# Patient Record
Sex: Male | Born: 1965 | Race: White | Hispanic: No | Marital: Single | Smoking: Former smoker
Health system: Southern US, Community
[De-identification: ages and names within clinical notes are randomized; demographics above are authoritative.]

## PROBLEM LIST (undated history)

## (undated) DIAGNOSIS — S2239XA Fracture of one rib, unspecified side, initial encounter for closed fracture: Secondary | ICD-10-CM

## (undated) DIAGNOSIS — S0990XA Unspecified injury of head, initial encounter: Secondary | ICD-10-CM

## (undated) DIAGNOSIS — S2249XA Multiple fractures of ribs, unspecified side, initial encounter for closed fracture: Secondary | ICD-10-CM

## (undated) HISTORY — PX: ABDOMINAL SURGERY: SHX537

---
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
## 1997-07-18 ENCOUNTER — Encounter: Admission: RE | Admit: 1997-07-18 | Discharge: 1997-10-16 | Payer: Self-pay

## 1997-07-31 ENCOUNTER — Encounter: Admission: RE | Admit: 1997-07-31 | Discharge: 1997-10-29 | Payer: Self-pay

## 1998-11-12 ENCOUNTER — Encounter: Admission: RE | Admit: 1998-11-12 | Discharge: 1999-02-10 | Payer: Self-pay

## 1999-04-17 ENCOUNTER — Emergency Department (HOSPITAL_COMMUNITY): Admission: EM | Admit: 1999-04-17 | Discharge: 1999-04-17 | Payer: Self-pay | Admitting: Emergency Medicine

## 1999-04-17 ENCOUNTER — Encounter: Payer: Self-pay | Admitting: Emergency Medicine

## 2000-12-06 ENCOUNTER — Emergency Department (HOSPITAL_COMMUNITY): Admission: EM | Admit: 2000-12-06 | Discharge: 2000-12-06 | Payer: Self-pay | Admitting: Emergency Medicine

## 2000-12-06 ENCOUNTER — Encounter: Payer: Self-pay | Admitting: Emergency Medicine

## 2005-07-26 ENCOUNTER — Emergency Department (HOSPITAL_COMMUNITY): Admission: EM | Admit: 2005-07-26 | Discharge: 2005-07-26 | Payer: Self-pay | Admitting: Emergency Medicine

## 2007-09-28 ENCOUNTER — Emergency Department (HOSPITAL_COMMUNITY): Admission: EM | Admit: 2007-09-28 | Discharge: 2007-09-29 | Payer: Self-pay | Admitting: Emergency Medicine

## 2011-01-14 LAB — BASIC METABOLIC PANEL
BUN: 18
CO2: 28
Calcium: 9
Chloride: 109
Creatinine, Ser: 0.97
GFR calc Af Amer: >60
GFR calc non Af Amer: >60
Glucose, Bld: 115 — ABNORMAL HIGH
Potassium: 3.7
Sodium: 138

## 2011-01-14 LAB — DIFFERENTIAL
Basophils Absolute: 0
Basophils Relative: 0
Eosinophils Absolute: 0.1
Eosinophils Relative: 0
Lymphocytes Relative: 6 — ABNORMAL LOW
Lymphs Abs: 1
Monocytes Absolute: 1.1 — ABNORMAL HIGH
Monocytes Relative: 7
Neutro Abs: 14.5 — ABNORMAL HIGH
Neutrophils Relative %: 86 — ABNORMAL HIGH

## 2011-01-14 LAB — CBC
HCT: 36.3 — ABNORMAL LOW
Hemoglobin: 12.8 — ABNORMAL LOW
MCHC: 35.3
MCV: 90.9
Platelets: 171
RBC: 3.99 — ABNORMAL LOW
RDW: 12.5
WBC: 16.8 — ABNORMAL HIGH

## 2011-06-19 ENCOUNTER — Encounter (HOSPITAL_COMMUNITY): Payer: Self-pay | Admitting: *Deleted

## 2011-06-19 ENCOUNTER — Emergency Department (HOSPITAL_COMMUNITY)
Admission: EM | Admit: 2011-06-19 | Discharge: 2011-06-19 | Disposition: A | Discharge status: Home / Self Care | Payer: Self-pay | Attending: Emergency Medicine | Admitting: Emergency Medicine

## 2011-06-19 ENCOUNTER — Emergency Department (HOSPITAL_COMMUNITY): Payer: Self-pay

## 2011-06-19 ENCOUNTER — Other Ambulatory Visit: Payer: Self-pay

## 2011-06-19 DIAGNOSIS — R05 Cough: Secondary | ICD-10-CM | POA: Insufficient documentation

## 2011-06-19 DIAGNOSIS — J40 Bronchitis, not specified as acute or chronic: Secondary | ICD-10-CM | POA: Insufficient documentation

## 2011-06-19 DIAGNOSIS — R079 Chest pain, unspecified: Secondary | ICD-10-CM | POA: Insufficient documentation

## 2011-06-19 DIAGNOSIS — R509 Fever, unspecified: Secondary | ICD-10-CM | POA: Insufficient documentation

## 2011-06-19 DIAGNOSIS — R059 Cough, unspecified: Secondary | ICD-10-CM | POA: Insufficient documentation

## 2011-06-19 HISTORY — DX: Unspecified injury of head, initial encounter: S09.90XA

## 2011-06-19 LAB — DIFFERENTIAL
Basophils Absolute: 0 10*3/uL (ref 0.0–0.1)
Basophils Relative: 0 % (ref 0–1)
Eosinophils Absolute: 0 10*3/uL (ref 0.0–0.7)
Eosinophils Relative: 0 % (ref 0–5)
Lymphocytes Relative: 20 % (ref 12–46)

## 2011-06-19 LAB — CBC
MCH: 31.3 pg (ref 26.0–34.0)
MCHC: 34.1 g/dL (ref 30.0–36.0)
MCV: 91.9 fL (ref 78.0–100.0)
Platelets: 134 10*3/uL — ABNORMAL LOW (ref 150–400)
RDW: 12.7 % (ref 11.5–15.5)
WBC: 10 10*3/uL (ref 4.0–10.5)

## 2011-06-19 LAB — BASIC METABOLIC PANEL
Calcium: 9.2 mg/dL (ref 8.4–10.5)
GFR calc Af Amer: >90 mL/min (ref >90)
GFR calc non Af Amer: 85 mL/min — ABNORMAL LOW (ref >90)
Sodium: 134 mEq/L — ABNORMAL LOW (ref 135–145)

## 2011-06-19 LAB — TROPONIN I: Troponin I: <0.3 ng/mL (ref <0.30)

## 2011-06-19 MED ORDER — GUAIFENESIN-CODEINE 100-10 MG/5ML PO SYRP: ORAL_SOLUTION | ORAL | Status: DC

## 2011-06-19 MED ORDER — HYDROCODONE-ACETAMINOPHEN 5-325 MG PO TABS
1.0000 | ORAL_TABLET | Freq: Once | ORAL | Status: AC
  Administered 2011-06-19: 1 via ORAL
  Filled 2011-06-19: qty 1

## 2011-06-19 MED ORDER — HYDROCODONE-ACETAMINOPHEN 5-325 MG PO TABS: 1.0000 | ORAL_TABLET | Freq: Once | ORAL | Status: DC

## 2011-06-19 MED ORDER — IBUPROFEN 800 MG PO TABS
800.0000 mg | ORAL_TABLET | Freq: Once | ORAL | Status: AC
  Administered 2011-06-19: 800 mg via ORAL
  Filled 2011-06-19: qty 1

## 2011-06-19 MED ORDER — IBUPROFEN 800 MG PO TABS: 800.0000 mg | ORAL_TABLET | Freq: Once | ORAL | Status: DC

## 2011-06-19 NOTE — ED Provider Notes (Signed)
History     CSN: [id number]  Arrival date & time 06/19/11  1478   First MD Initiated Contact with Patient 06/19/11 [phone]      Chief Complaint  Patient presents with  . Chest Pain    (Consider location/radiation/quality/duration/timing/severity/associated sxs/prior treatment) HPI Comments: Pt has had cough prod of brownish phlegm.  + subjecrtive fever and chills.  Pressure like pain all over anterior chest unchanged x 2 days.  "sharp" R upper anterior CP worse with deep inspiration.  Patient is a 46 y.o. male presenting with chest pain. The history is provided by the patient. No language interpreter was used.  Chest Pain The chest pain began 2 days ago. Chest pain occurs constantly. The chest pain is unchanged. The pain is associated with breathing. The severity of the pain is moderate. The quality of the pain is described as pressure-like and sharp. The pain does not radiate. Chest pain is worsened by deep breathing. Primary symptoms include cough. Pertinent negatives for primary symptoms include no fever, no shortness of breath, no wheezing, no nausea and no vomiting.  Pertinent negatives for associated symptoms include no claudication, no diaphoresis, no lower extremity edema, no near-syncope, no numbness, no orthopnea, no paroxysmal nocturnal dyspnea and no weakness. He tried nothing for the symptoms. Risk factors include male gender and smoking/tobacco exposure.     Past Medical History  Diagnosis Date  . Head injury     Past Surgical History  Procedure Date  . Abdominal surgery     No family history on file.  History  Substance Use Topics  . Smoking status: Current Everyday Smoker  . Smokeless tobacco: Not on file  . Alcohol Use: No      Review of Systems  Constitutional: Negative for fever and diaphoresis.  Respiratory: Positive for cough. Negative for shortness of breath and wheezing.   Cardiovascular: Positive for chest pain. Negative for orthopnea, claudication  and near-syncope.  Gastrointestinal: Negative for nausea and vomiting.  Neurological: Negative for syncope, weakness, light-headedness, numbness and headaches.  All other systems reviewed and are negative.    Allergies  Review of patient's allergies indicates no known allergies.  Home Medications   Current Outpatient Rx  Name Route Sig Dispense Refill  . GUAIFENESIN-CODEINE 100-10 MG/5ML PO SYRP  10 ml q 4-6 hrs prn cough 240 mL 0    BP 104/74  Pulse 73  Temp(Src) 98.9 F (37.2 C) (Oral)  Resp 18  Ht 5\' 6"  (1.676 m)  Wt 155 lb (70.308 kg)  BMI 25.02 kg/m2  SpO2 95%  Physical Exam  Nursing note and vitals reviewed. Constitutional: He is oriented to person, place, and time. He appears well-developed and well-nourished.  HENT:  Head: Normocephalic and atraumatic.  Eyes: EOM are normal.  Neck: Normal range of motion.  Cardiovascular: Normal rate, regular rhythm, S1 normal, S2 normal, normal heart sounds and intact distal pulses.   No extrasystoles are present. PMI is not displaced.  Exam reveals no gallop, no distant heart sounds, no friction rub and no decreased pulses.   No murmur heard. Pulmonary/Chest: Effort normal and breath sounds normal. No accessory muscle usage. Not tachypneic. No respiratory distress. He has no decreased breath sounds. He has no wheezes. He has no rhonchi. He has no rales. He exhibits no tenderness.  Abdominal: Soft. He exhibits no distension. There is no tenderness.  Musculoskeletal: Normal range of motion.  Neurological: He is alert and oriented to person, place, and time.  Skin: Skin is warm and  dry.  Psychiatric: He has a normal mood and affect. Judgment normal.    ED Course  Procedures (including critical care time)  Labs Reviewed  CBC - Abnormal; Notable for the following:    Platelets 134 (*)    All other components within normal limits  DIFFERENTIAL - Abnormal; Notable for the following:    Monocytes Absolute 1.2 (*)    All other  components within normal limits  BASIC METABOLIC PANEL - Abnormal; Notable for the following:    Sodium 134 (*)    GFR calc non Af Amer 85 (*)    All other components within normal limits  TROPONIN I   Dg Chest 2 View  06/19/2011  *RADIOLOGY REPORT*  Clinical Data: 46 year old male with cough, fever and congestion.  CHEST - 2 VIEW  Comparison: 09/29/2007 chest radiograph  Findings: The cardiomediastinal silhouette is unremarkable. Elevation of the right hemidiaphragm is again noted with mild right basilar atelectasis/scarring. There may be very small bilateral pleural effusions present. There is no evidence of pneumothorax or pulmonary edema. No acute bony abnormalities are identified.  IMPRESSION: Mild right basilar atelectasis/scarring.  Question very small bilateral pleural effusions.  Original Report Authenticated By: Rosendo Gros, M.D.    Date: 06/19/2011  Rate: 72  Rhythm: normal sinus rhythm  QRS Axis: normal  Intervals: normal  ST/T Wave abnormalities: normal  Conduction Disutrbances:none  Narrative Interpretation:   Old EKG Reviewed: none available    1. Bronchitis   2. Chest pain       MDM  Cardiac w/u nl.  Bronchitis clinically. rx-cough medicine.  Return if sxs wworsen or change.        Worthy Rancher, PA 06/19/11 1317  Worthy Rancher, PA 06/25/11 [phone]

## 2011-06-19 NOTE — ED Notes (Signed)
Pa in with pt at this time 

## 2011-06-19 NOTE — ED Notes (Signed)
Family at bedside. Patient states he is not feeling any better Patient is still having pain. Patient states his pain is around a 7 at this time. RN notified.

## 2011-06-19 NOTE — ED Notes (Signed)
Non radiating, substernal CP began 2 days ago. Described as "pressure" pain which became worse last night.

## 2011-06-19 NOTE — ED Provider Notes (Signed)
Patient here with c/o chest pain worse with exertion, and movement.   Cor: RRR Chest: clear to auscultation all fields Chest wall: mild tenderness   Labs, troponin, EKG and chest xray without acute findings.   Nicoletta Dress. Colon Branch, MD 06/19/11 1500

## 2011-06-19 NOTE — Discharge Instructions (Signed)
Bronchitis Bronchitis is the body's way of reacting to injury and/or infection (inflammation) of the bronchi. Bronchi are the air tubes that extend from the windpipe into the lungs. If the inflammation becomes severe, it may cause shortness of breath. CAUSES  Inflammation may be caused by:  A virus.   Germs (bacteria).   Dust.   Allergens.   Pollutants and many other irritants.  The cells lining the bronchial tree are covered with tiny hairs (cilia). These constantly beat upward, away from the lungs, toward the mouth. This keeps the lungs free of pollutants. When these cells become too irritated and are unable to do their job, mucus begins to develop. This causes the characteristic cough of bronchitis. The cough clears the lungs when the cilia are unable to do their job. Without either of these protective mechanisms, the mucus would settle in the lungs. Then you would develop pneumonia. Smoking is a common cause of bronchitis and can contribute to pneumonia. Stopping this habit is the single most important thing you can do to help yourself. TREATMENT   Your caregiver may prescribe an antibiotic if the cough is caused by bacteria. Also, medicines that open up your airways make it easier to breathe. Your caregiver may also recommend or prescribe an expectorant. It will loosen the mucus to be coughed up. Only take over-the-counter or prescription medicines for pain, discomfort, or fever as directed by your caregiver.   Removing whatever causes the problem (smoking, for example) is critical to preventing the problem from getting worse.   Cough suppressants may be prescribed for relief of cough symptoms.   Inhaled medicines may be prescribed to help with symptoms now and to help prevent problems from returning.   For those with recurrent (chronic) bronchitis, there may be a need for steroid medicines.  SEEK IMMEDIATE MEDICAL CARE IF:   During treatment, you develop more pus-like mucus  (purulent sputum).   You have a fever.   Your baby is older than 3 months with a rectal temperature of 102 F (38.9 C) or higher.   Your baby is 22 months old or younger with a rectal temperature of 100.4 F (38 C) or higher.   You become progressively more ill.   You have increased difficulty breathing, wheezing, or shortness of breath.  It is necessary to seek immediate medical care if you are elderly or sick from any other disease. MAKE SURE YOU:   Understand these instructions.   Will watch your condition.   Will get help right away if you are not doing well or get worse.  Document Released: 04/05/2005 Document Revised: 12/16/2010 Document Reviewed: 02/13/2008 [facility] Patient Information 2012 [place], [place].Chest Pain (Nonspecific) It is often hard to give a specific diagnosis for the cause of chest pain. There is always a chance that your pain could be related to something serious, such as a heart attack or a blood clot in the lungs. You need to follow up with your caregiver for further evaluation. CAUSES   Heartburn.   Pneumonia or bronchitis.   Anxiety and stress.   Inflammation around your heart (pericarditis) or lung (pleuritis or pleurisy).   A blood clot in the lung.   A collapsed lung (pneumothorax). It can develop suddenly on its own (spontaneous pneumothorax) or from injury (trauma) to the chest.  The chest wall is composed of bones, muscles, and cartilage. Any of these can be the source of the pain.  The bones can be bruised by injury.   The  muscles or cartilage can be strained by coughing or overwork.   The cartilage can be affected by inflammation and become sore (costochondritis).  DIAGNOSIS  Lab tests or other studies, such as X-rays, an EKG, stress testing, or cardiac imaging, may be needed to find the cause of your pain.  TREATMENT   Treatment depends on what may be causing your chest pain. Treatment may include:   Acid blockers for heartburn.     Anti-inflammatory medicine.   Pain medicine for inflammatory conditions.   Antibiotics if an infection is present.   You may be advised to change lifestyle habits. This includes stopping smoking and avoiding caffeine and chocolate.   You may be advised to keep your head raised (elevated) when sleeping. This reduces the chance of acid going backward from your stomach into your esophagus.   Most of the time, nonspecific chest pain will improve within 2 to 3 days with rest and mild pain medicine.  HOME CARE INSTRUCTIONS   If antibiotics were prescribed, take the full amount even if you start to feel better.   For the next few days, avoid physical activities that bring on chest pain. Continue physical activities as directed.   Do not smoke cigarettes or drink alcohol until your symptoms are gone.   Only take over-the-counter or prescription medicine for pain, discomfort, or fever as directed by your caregiver.   Follow your caregiver's suggestions for further testing if your chest pain does not go away.   Keep any follow-up appointments you made. If you do not go to an appointment, you could develop lasting (chronic) problems with pain. If there is any problem keeping an appointment, you must call to reschedule.  SEEK MEDICAL CARE IF:   You think you are having problems from the medicine you are taking. Read your medicine instructions carefully.   Your chest pain does not go away, even after treatment.   You develop a rash with blisters on your chest.  SEEK IMMEDIATE MEDICAL CARE IF:   You have increased chest pain or pain that spreads to your arm, neck, jaw, back, or belly (abdomen).   You develop shortness of breath, an increasing cough, or you are coughing up blood.   You have severe back or abdominal pain, feel sick to your stomach (nauseous) or throw up (vomit).   You develop severe weakness, fainting, or chills.   You have an oral temperature above 102 F (38.9 C), not  controlled by medicine.  THIS IS AN EMERGENCY. Do not wait to see if the pain will go away. Get medical help at once. Call your local emergency services (911 in [place] Do not drive yourself to the hospital. MAKE SURE YOU:   Understand these instructions.   Will watch your condition.   Will get help right away if you are not doing well or get worse.  Document Released: 01/13/2005 Document Revised: 12/16/2010 Document Reviewed: 11/09/2007 [facility] Patient Information 2012 [place], [place].   Your cardiac work up is normal.  The chest x-ray is normal but clinically you have bronchitis.  Take ibuprofen 800 mg every 8 hrs with food.  Take the cough medicine as directed.  Return to the ED if your symptoms worsen or change significantly.

## 2011-06-26 NOTE — ED Provider Notes (Signed)
Medical screening examination/treatment/procedure(s) were conducted as a shared visit with non-physician practitioner(s) and myself.  I personally evaluated the patient during the encounter  Nicoletta Dress. Colon Branch, MD 06/26/11 1431

## 2014-10-04 ENCOUNTER — Encounter (HOSPITAL_COMMUNITY): Payer: Self-pay | Admitting: *Deleted

## 2014-10-04 ENCOUNTER — Emergency Department (HOSPITAL_COMMUNITY): Payer: Self-pay

## 2014-10-04 ENCOUNTER — Emergency Department (HOSPITAL_COMMUNITY)
Admission: EM | Admit: 2014-10-04 | Discharge: 2014-10-04 | Disposition: A | Discharge status: Home / Self Care | Payer: Self-pay | Attending: Emergency Medicine | Admitting: Emergency Medicine

## 2014-10-04 DIAGNOSIS — Z720 Tobacco use: Secondary | ICD-10-CM | POA: Insufficient documentation

## 2014-10-04 DIAGNOSIS — W19XXXA Unspecified fall, initial encounter: Secondary | ICD-10-CM

## 2014-10-04 DIAGNOSIS — Y93E1 Activity, personal bathing and showering: Secondary | ICD-10-CM | POA: Insufficient documentation

## 2014-10-04 DIAGNOSIS — Y998 Other external cause status: Secondary | ICD-10-CM | POA: Insufficient documentation

## 2014-10-04 DIAGNOSIS — W01198A Fall on same level from slipping, tripping and stumbling with subsequent striking against other object, initial encounter: Secondary | ICD-10-CM | POA: Insufficient documentation

## 2014-10-04 DIAGNOSIS — S7001XA Contusion of right hip, initial encounter: Secondary | ICD-10-CM | POA: Insufficient documentation

## 2014-10-04 DIAGNOSIS — Y92091 Bathroom in other non-institutional residence as the place of occurrence of the external cause: Secondary | ICD-10-CM | POA: Insufficient documentation

## 2014-10-04 DIAGNOSIS — G8929 Other chronic pain: Secondary | ICD-10-CM | POA: Insufficient documentation

## 2014-10-04 DIAGNOSIS — S39012A Strain of muscle, fascia and tendon of lower back, initial encounter: Secondary | ICD-10-CM | POA: Insufficient documentation

## 2014-10-04 MED ORDER — CYCLOBENZAPRINE HCL 10 MG PO TABS: 10.0000 mg | ORAL_TABLET | Freq: Two times a day (BID) | ORAL | Status: DC | PRN

## 2014-10-04 MED ORDER — FENTANYL CITRATE (PF) 100 MCG/2ML IJ SOLN
50.0000 ug | Freq: Once | INTRAMUSCULAR | Status: AC
  Administered 2014-10-04: 50 ug via INTRAMUSCULAR
  Filled 2014-10-04: qty 2

## 2014-10-04 MED ORDER — HYDROCODONE-ACETAMINOPHEN 5-325 MG PO TABS: 1.0000 | ORAL_TABLET | Freq: Four times a day (QID) | ORAL | Status: DC | PRN

## 2014-10-04 MED ORDER — NAPROXEN 500 MG PO TABS: 500.0000 mg | ORAL_TABLET | Freq: Two times a day (BID) | ORAL | Status: DC

## 2014-10-04 NOTE — ED Notes (Signed)
Pt fell backwards while getting out of the shower and hit the toilet. Pain to right hip. Fall occurred at ~0700

## 2014-10-04 NOTE — ED Provider Notes (Signed)
CSN: [id number]     Arrival date & time 10/04/14  0800 History  This chart was scribed for Vanetta Mulders, MD by Placido Sou, ED scribe. This patient was seen in room APA01/APA01 and the patient's care was started at 8:24 AM.    Chief Complaint  Patient presents with  . Hip Pain    Patient is a 49 y.o. male presenting with hip pain. The history is provided by the patient. No language interpreter was used.  Hip Pain The current episode started 1 to 2 hours ago. The problem occurs constantly. The problem has not changed since onset.Pertinent negatives include no chest pain, no abdominal pain, no headaches and no shortness of breath. The symptoms are aggravated by standing and exertion. He has tried nothing for the symptoms.    HPI Comments: Caleb Blake is a 49 y.o. male, with a history of chronic neck pain, who presents to the Emergency Department complaining of a fall that occurred 2 hours ago. Pt notes falling backwards while exiting the shower. He notes lower right back and hip pain. He notes being able to ambulate but does note a worsening of symptoms. He would rate the pain as a 5/10 when immobile and 9/10 when ambulatory. He denies seeing an orthopedist in the past and doesn't currently have a PCP. He denies any other pain due to the fall. Pt denies LOC, head trauma, numbness in right foot, fever, chills, visual changes, cold symptoms, CP, SOB, abd pain, nausea, vomiting, diarrhea, dysuria, leg swelling, rash, history of bleeding easily, and HA.  Past Medical History  Diagnosis Date  . Head injury    Past Surgical History  Procedure Laterality Date  . Abdominal surgery     No family history on file. History  Substance Use Topics  . Smoking status: Current Every Day Smoker    Types: Cigarettes  . Smokeless tobacco: Not on file  . Alcohol Use: No    Review of Systems  Constitutional: Negative for fever and chills.  HENT: Negative for congestion, rhinorrhea and sore throat.    Eyes: Negative for visual disturbance.  Respiratory: Negative for shortness of breath.   Cardiovascular: Negative for chest pain and leg swelling.  Gastrointestinal: Negative for nausea, vomiting, abdominal pain and diarrhea.  Genitourinary: Negative for dysuria.  Musculoskeletal: Positive for back pain, neck pain and neck stiffness.  Skin: Negative for rash.  Neurological: Negative for syncope, numbness and headaches.  Hematological: Does not bruise/bleed easily.  Psychiatric/Behavioral: Negative for confusion.      Allergies  Review of patient's allergies indicates no known allergies.  Home Medications   Prior to Admission medications   Medication Sig Start Date End Date Taking? Authorizing Provider  cyclobenzaprine (FLEXERIL) 10 MG tablet Take 1 tablet (10 mg total) by mouth 2 (two) times daily as needed for muscle spasms. 10/04/14   Vanetta Mulders, MD  HYDROcodone-acetaminophen (NORCO/VICODIN) 5-325 MG per tablet Take 1-2 tablets by mouth every 6 (six) hours as needed. 10/04/14   Vanetta Mulders, MD  naproxen (NAPROSYN) 500 MG tablet Take 1 tablet (500 mg total) by mouth 2 (two) times daily. 10/04/14   Vanetta Mulders, MD   BP 142/87 mmHg  Pulse 50  Temp(Src) 98 F (36.7 C) (Oral)  Resp 16  Ht 5\' 6"  (1.676 m)  Wt 140 lb (63.504 kg)  BMI 22.61 kg/m2  SpO2 100% Physical Exam  Constitutional: He is oriented to person, place, and time. He appears well-developed and well-nourished. No distress.  HENT:  Head: Normocephalic and atraumatic.  Mouth/Throat: Oropharynx is clear and moist.  Eyes: Conjunctivae and EOM are normal. Pupils are equal, round, and reactive to light. No scleral icterus.  Neck: Normal range of motion. Neck supple. No tracheal deviation present.  Cardiovascular: Normal rate, regular rhythm, normal heart sounds and intact distal pulses.   1+ DP pulse right side  Pulmonary/Chest: Effort normal and breath sounds normal. No respiratory distress. He has no  wheezes. He has no rales.  Abdominal: Soft. Bowel sounds are normal. There is no tenderness.  Musculoskeletal: Normal range of motion. He exhibits tenderness. He exhibits no edema.  No edema in right knee; non-tender in right thigh, right buttox and over lumbar spine tender in right lower back; slightly tender in right hip  Neurological: He is alert and oriented to person, place, and time. No cranial nerve deficit. He exhibits normal muscle tone. Coordination normal.  Skin: Skin is warm and dry.  Psychiatric: He has a normal mood and affect. His behavior is normal.  Nursing note and vitals reviewed.   ED Course  Procedures  DIAGNOSTIC STUDIES: Oxygen Saturation is 100% on RA, normal by my interpretation.    COORDINATION OF CARE: 8:31 AM Discussed treatment plan with pt at bedside including a shot of pain medication and x-rays of the affected area and pt agreed to plan.  Labs Review Labs Reviewed - No data to display  Imaging Review Dg Lumbar Spine Complete  10/04/2014   CLINICAL DATA:  Fall.  Right hip pain.  EXAM: LUMBAR SPINE - COMPLETE 4+ VIEW  COMPARISON:  None.  FINDINGS: There is 10 degrees of levoconvex lumbar scoliosis between L5 and T12.  The bowel anastomotic clips noted in the right abdomen.  Visualized sacral arcuate lines appear intact.  Mild degenerative arthropathy of the facet joints of L5-S1 bilaterally.  No vertebral subluxation is observed. Anterior interbody spurring at several levels, most notably L3-4.  IMPRESSION: No lumbar spine fracture or acute subluxation. Mild lumbar spondylosis.   Electronically Signed   By: Gaylyn Rong M.D.   On: 10/04/2014 09:38   Dg Hips Bilat With Pelvis Min 5 Views  10/04/2014   CLINICAL DATA:  Fall, fell backwards getting out of the shower. The toilet. Right hip pain.  EXAM: BILATERAL HIP (WITH PELVIS) 5-6 VIEWS  COMPARISON:  None.  FINDINGS: There is no evidence of hip fracture or dislocation. There is no evidence of arthropathy  or other focal bone abnormality.  IMPRESSION: No acute osseous injury of bilateral hips.   Electronically Signed   By: Elige Ko   On: 10/04/2014 09:37     EKG Interpretation None      MDM   Final diagnoses:  Fall  Contusion, hip, right, initial encounter  Lumbar strain, initial encounter    X-rays of the back and pelvic and hip area without any acute findings. Patient in no acute distress. No other significant injuries. Will treat symptomatically.     I personally performed the services described in this documentation, which was scribed in my presence. The recorded information has been reviewed and is accurate.     Vanetta Mulders, MD 10/04/14 1209

## 2014-10-04 NOTE — Discharge Instructions (Signed)
X-rays of the back and hip and pelvis area without any acute findings. Take the Naprosyn on a regular basis. Take Flexeril as needed. Take the hydrocodone as needed. Return for any new or worse symptoms. Expect to be sore and stiff for several days.

## 2019-08-15 ENCOUNTER — Encounter (HOSPITAL_COMMUNITY): Payer: Self-pay | Admitting: Emergency Medicine

## 2019-08-15 ENCOUNTER — Other Ambulatory Visit: Payer: Self-pay

## 2019-08-15 ENCOUNTER — Emergency Department (HOSPITAL_COMMUNITY): Payer: Medicare Other

## 2019-08-15 ENCOUNTER — Inpatient Hospital Stay (HOSPITAL_COMMUNITY)
Admission: EM | Admit: 2019-08-15 | Discharge: 2019-08-19 | DRG: 551 | Disposition: A | Discharge status: Home Health | Payer: Medicare Other | Attending: Surgery | Admitting: Surgery

## 2019-08-15 DIAGNOSIS — Y93H9 Activity, other involving exterior property and land maintenance, building and construction: Secondary | ICD-10-CM

## 2019-08-15 DIAGNOSIS — S22038A Other fracture of third thoracic vertebra, initial encounter for closed fracture: Principal | ICD-10-CM | POA: Diagnosis present

## 2019-08-15 DIAGNOSIS — S2241XA Multiple fractures of ribs, right side, initial encounter for closed fracture: Secondary | ICD-10-CM | POA: Diagnosis present

## 2019-08-15 DIAGNOSIS — Z20822 Contact with and (suspected) exposure to covid-19: Secondary | ICD-10-CM | POA: Diagnosis present

## 2019-08-15 DIAGNOSIS — S22058A Other fracture of T5-T6 vertebra, initial encounter for closed fracture: Secondary | ICD-10-CM | POA: Diagnosis present

## 2019-08-15 DIAGNOSIS — Z791 Long term (current) use of non-steroidal anti-inflammatories (NSAID): Secondary | ICD-10-CM

## 2019-08-15 DIAGNOSIS — W1789XA Other fall from one level to another, initial encounter: Secondary | ICD-10-CM | POA: Diagnosis present

## 2019-08-15 DIAGNOSIS — F17210 Nicotine dependence, cigarettes, uncomplicated: Secondary | ICD-10-CM | POA: Diagnosis present

## 2019-08-15 DIAGNOSIS — Y929 Unspecified place or not applicable: Secondary | ICD-10-CM | POA: Diagnosis not present

## 2019-08-15 DIAGNOSIS — S22048A Other fracture of fourth thoracic vertebra, initial encounter for closed fracture: Secondary | ICD-10-CM | POA: Diagnosis present

## 2019-08-15 DIAGNOSIS — S271XXA Traumatic hemothorax, initial encounter: Secondary | ICD-10-CM | POA: Diagnosis present

## 2019-08-15 DIAGNOSIS — T1490XA Injury, unspecified, initial encounter: Secondary | ICD-10-CM | POA: Diagnosis present

## 2019-08-15 DIAGNOSIS — Z880 Allergy status to penicillin: Secondary | ICD-10-CM

## 2019-08-15 DIAGNOSIS — S22009A Unspecified fracture of unspecified thoracic vertebra, initial encounter for closed fracture: Secondary | ICD-10-CM

## 2019-08-15 DIAGNOSIS — W19XXXA Unspecified fall, initial encounter: Secondary | ICD-10-CM

## 2019-08-15 LAB — CBC WITH DIFFERENTIAL/PLATELET
Abs Immature Granulocytes: 0.11 10*3/uL — ABNORMAL HIGH (ref 0.00–0.07)
Basophils Absolute: 0.1 10*3/uL (ref 0.0–0.1)
Basophils Relative: 0 %
Eosinophils Absolute: 0 10*3/uL (ref 0.0–0.5)
Eosinophils Relative: 0 %
HCT: 45 % (ref 39.0–52.0)
Hemoglobin: 15.1 g/dL (ref 13.0–17.0)
Immature Granulocytes: 1 %
Lymphocytes Relative: 4 %
Lymphs Abs: 0.8 10*3/uL (ref 0.7–4.0)
MCH: 31.9 pg (ref 26.0–34.0)
MCHC: 33.6 g/dL (ref 30.0–36.0)
MCV: 95.1 fL (ref 80.0–100.0)
Monocytes Absolute: 1.1 10*3/uL — ABNORMAL HIGH (ref 0.1–1.0)
Monocytes Relative: 6 %
Neutro Abs: 17.5 10*3/uL — ABNORMAL HIGH (ref 1.7–7.7)
Neutrophils Relative %: 89 %
Platelets: 231 10*3/uL (ref 150–400)
RBC: 4.73 MIL/uL (ref 4.22–5.81)
RDW: 12.3 % (ref 11.5–15.5)
WBC: 19.6 10*3/uL — ABNORMAL HIGH (ref 4.0–10.5)
nRBC: 0 % (ref 0.0–0.2)

## 2019-08-15 LAB — BASIC METABOLIC PANEL
Anion gap: 12 (ref 5–15)
BUN: 17 mg/dL (ref 6–20)
CO2: 24 mmol/L (ref 22–32)
Calcium: 9.2 mg/dL (ref 8.9–10.3)
Chloride: 100 mmol/L (ref 98–111)
Creatinine, Ser: 1.01 mg/dL (ref 0.61–1.24)
GFR calc Af Amer: >60 mL/min (ref >60)
GFR calc non Af Amer: >60 mL/min (ref >60)
Glucose, Bld: 108 mg/dL — ABNORMAL HIGH (ref 70–99)
Potassium: 4.1 mmol/L (ref 3.5–5.1)
Sodium: 136 mmol/L (ref 135–145)

## 2019-08-15 LAB — RESPIRATORY PANEL BY RT PCR (FLU A&B, COVID)
Influenza A by PCR: NEGATIVE
Influenza B by PCR: NEGATIVE
SARS Coronavirus 2 by RT PCR: NEGATIVE

## 2019-08-15 LAB — PROTIME-INR
INR: 0.9 (ref 0.8–1.2)
Prothrombin Time: 12.2 seconds (ref 11.4–15.2)

## 2019-08-15 MED ORDER — HYDROMORPHONE HCL 1 MG/ML IJ SOLN
1.0000 mg | INTRAMUSCULAR | Status: DC | PRN
  Administered 2019-08-15 – 2019-08-16 (×6): 1 mg via INTRAVENOUS
  Filled 2019-08-15 (×6): qty 1

## 2019-08-15 MED ORDER — MORPHINE SULFATE (PF) 4 MG/ML IV SOLN
4.0000 mg | Freq: Once | INTRAVENOUS | Status: AC
  Administered 2019-08-15: 16:00:00 4 mg via INTRAMUSCULAR
  Filled 2019-08-15: qty 1

## 2019-08-15 MED ORDER — LIDOCAINE HCL (PF) 1 % IJ SOLN
30.0000 mL | Freq: Once | INTRAMUSCULAR | Status: DC
  Filled 2019-08-15: qty 30

## 2019-08-15 NOTE — ED Triage Notes (Addendum)
Pt reports was reaching and trying to paint and reports fell approximately 10 ft off of a deck. Pt denies hitting head or loc,dizziness/weakness/cp prior to fall. Pt reports right flank/rib cage pain that is worse with movement. Airway patent. No obvious deformity/bruise or contusion noted.

## 2019-08-15 NOTE — ED Provider Notes (Signed)
[facility] EMERGENCY DEPARTMENT Provider Note   CSN: [id number] Arrival date & time: 08/15/19  1436     History Chief Complaint  Patient presents with  . Fall    Caleb Blake is a 54 y.o. male.  HPI   Patient presents after a fall.  Fall was approximately 10 feet off the side of a deck.  He denies head trauma, loss of consciousness.  He notes that since the fall he has had pain in the right thorax, not abdomen.  Pain is worse with motion, breathing.  He denies confusion, disorientation, head pain, neck pain, weakness in any extremity. He states that he was well prior to this event. He takes no blood thinning medication. The pain is sore, severe, nonradiating focally around the right axilla. Past Medical History:  Diagnosis Date  . Head injury     There are no problems to display for this patient.   Past Surgical History:  Procedure Laterality Date  . ABDOMINAL SURGERY         History reviewed. No pertinent family history.  Social History   Tobacco Use  . Smoking status: Current Every Day Smoker    Packs/day: 0.50    Types: Cigarettes  . Smokeless tobacco: Never Used  Substance Use Topics  . Alcohol use: No  . Drug use: No    Home Medications Prior to Admission medications   Medication Sig Start Date End Date Taking? Authorizing Provider  cyclobenzaprine (FLEXERIL) 10 MG tablet Take 1 tablet (10 mg total) by mouth 2 (two) times daily as needed for muscle spasms. 10/04/14   Fredia Sorrow, MD  HYDROcodone-acetaminophen (NORCO/VICODIN) 5-325 MG per tablet Take 1-2 tablets by mouth every 6 (six) hours as needed. 10/04/14   Fredia Sorrow, MD  naproxen (NAPROSYN) 500 MG tablet Take 1 tablet (500 mg total) by mouth 2 (two) times daily. 10/04/14   Fredia Sorrow, MD    Allergies    Penicillins  Review of Systems   Review of Systems  Constitutional:       Per HPI, otherwise negative  HENT:       Per HPI, otherwise negative  Respiratory:       Per  HPI, otherwise negative  Cardiovascular:       Per HPI, otherwise negative  Gastrointestinal: Negative for vomiting.  Endocrine:       Negative aside from HPI  Genitourinary:       Neg aside from HPI   Musculoskeletal:       Per HPI, otherwise negative  Skin: Negative.  Negative for wound.  Neurological: Negative for syncope.    Physical Exam Updated Vital Signs BP 127/87   Pulse 74   Temp 98.1 F (36.7 C) (Oral)   Resp 20   Ht 5\' 5"  (1.651 m)   Wt 65.8 kg   SpO2 96%   BMI 24.13 kg/m   Physical Exam Vitals and nursing note reviewed.  Constitutional:      Appearance: He is well-developed.     Comments: Thin adult male awake and alert speaking clearly.  HENT:     Head: Normocephalic and atraumatic.  Eyes:     Conjunctiva/sclera: Conjunctivae normal.  Cardiovascular:     Rate and Rhythm: Regular rhythm.  Pulmonary:     Effort: Pulmonary effort is normal. No respiratory distress.     Breath sounds: No stridor.  Chest:    Abdominal:     General: There is no distension.     Tenderness: There  is no abdominal tenderness. There is no guarding or rebound.  Skin:    General: Skin is warm and dry.  Neurological:     Mental Status: He is alert and oriented to person, place, and time.      ED Results / Procedures / Treatments   Labs (all labs ordered are listed, but only abnormal results are displayed) Labs Reviewed  BASIC METABOLIC PANEL - Abnormal; Notable for the following components:      Result Value   Glucose, Bld 108 (*)    All other components within normal limits  CBC WITH DIFFERENTIAL/PLATELET - Abnormal; Notable for the following components:   WBC 19.6 (*)    Neutro Abs 17.5 (*)    Monocytes Absolute 1.1 (*)    Abs Immature Granulocytes 0.11 (*)    All other components within normal limits  RESPIRATORY PANEL BY RT PCR (FLU A&B, COVID)  PROTIME-INR    Radiology CT Chest Wo Contrast  Result Date: 08/15/2019 CLINICAL DATA:  Fall, right flank and  rib pain EXAM: CT CHEST WITHOUT CONTRAST TECHNIQUE: Multidetector CT imaging of the chest was performed following the standard protocol without IV contrast. COMPARISON:  None. FINDINGS: Cardiovascular: Normal heart size.  No pericardial effusion. Mediastinum/Nodes: No enlarged lymph nodes. Thyroid is unremarkable. Lungs/Pleura: Small right hydropneumothorax. Right lower lobe dependent atelectasis. Minimal left lower lobe dependent atelectasis. Upper Abdomen: Cholelithiasis. Musculoskeletal: Mild subcutaneous emphysema. Multiple rib fractures with partial imaging of the inferior right: Nondisplaced posterior right first rib Nondisplaced anterolateral right third and fourth ribs Variably displaced posterolateral fifth-tenth ribs. Nondisplaced posterior eleventh rib. Probable nondisplaced fractures of the right third through fifth transverse processes. IMPRESSION: Acute fractures of nearly all right ribs, which are partially imaged. There is variable displacement of the fifth-tenth ribs. Probable nondisplaced fractures of the right third-fifth transverse processes. Small right hydropneumothorax.  Bibasilar atelectasis. Electronically Signed   By: Guadlupe Spanish M.D.   On: 08/15/2019 17:12   DG Chest Port 1 View  Result Date: 08/15/2019 CLINICAL DATA:  Fall with possible rib fracture right. Rule out pneumothorax. Fall off deck. Severe right-sided pain. EXAM: PORTABLE CHEST 1 VIEW COMPARISON:  Radiograph 06/19/2011 FINDINGS: Multiple right rib fractures including ribs 4 through 9 posteriorly. No new these fractures are displaced. There is small amount of subcutaneous gas in the right chest wall. No visualized pneumothorax. Chronic elevation of right hemidiaphragm with adjacent scarring. The heart is normal in size with unchanged mediastinal contours. No evidence of acute left rib fracture. No obvious scapular or shoulder girdle fracture. IMPRESSION: Multiple right rib fractures including ribs 4 through 9 posteriorly,  many of which are displaced. No visualized pneumothorax. Small amount of subcutaneous gas in the right chest wall. Chest CT is planned. Electronically Signed   By: Narda Rutherford M.D.   On: 08/15/2019 16:01    Procedures Procedures (including critical care time)  Medications Ordered in ED Medications  lidocaine (PF) (XYLOCAINE) 1 % injection 30 mL (has no administration in time range)  HYDROmorphone (DILAUDID) injection 1 mg (has no administration in time range)  morphine 4 MG/ML injection 4 mg (4 mg Intramuscular Given 08/15/19 1540)    ED Course  I have reviewed the triage vital signs and the nursing notes.  Pertinent labs & imaging results that were available during my care of the patient were reviewed by me and considered in my medical decision making (see chart for details).   After the initial evaluation I reviewed the patient's point-of-care x-ray, concern for multiple  rib fractures, questionable pneumothorax.  With this consideration patient will have CT scan of his chest performed.  Update:, Now, I discussed the patient's CT imaging with him and his brother, after I reviewed the images myself. There is concern for fracture of multiple ribs, indeed, 11 of the 12 right-sided ribs, as well as 3 vertebrae all with evidence for fracture. There is a very small hydropneumothorax as well.  Update: I discussed patient's case with our local surgeon, and subsequently with our trauma surgeon on-call given the extent of his injuries. Patient's labs reviewed, x-rays, CTs reviewed, discussed. With consideration of multiple rib fractures, thoracic vertebral transverse process fractures, the patient will require admission to our advanced care hospital, trauma team.  Patient, brother both aware of need for transfer, amenable to it. MDM Rules/Calculators/A&P                      MDM Number of Diagnoses or Management Options Closed fracture of multiple ribs of right side, initial encounter:  new, needed workup Closed fracture of multiple thoracic vertebrae, initial encounter [facility]): new, needed workup Trauma: new, needed workup   Amount and/or Complexity of Data Reviewed Clinical lab tests: reviewed Tests in the radiology section of CPT: reviewed Tests in the medicine section of CPT: reviewed Discussion of test results with the performing providers: yes Decide to obtain previous medical records or to obtain history from someone other than the patient: yes Obtain history from someone other than the patient: yes Discuss the patient with other providers: yes Independent visualization of images, tracings, or specimens: yes  Risk of Complications, Morbidity, and/or Mortality Presenting problems: high Diagnostic procedures: high Management options: high  Critical Care Total time providing critical care: 30-74 minutes  Patient Progress Patient progress: stable  Final Clinical Impression(s) / ED Diagnoses Final diagnoses:  Trauma  Closed fracture of multiple ribs of right side, initial encounter  Closed fracture of multiple thoracic vertebrae, initial encounter [facility])  Fall, initial encounter     Gerhard Munch, MD 08/15/19 1824

## 2019-08-16 ENCOUNTER — Inpatient Hospital Stay (HOSPITAL_COMMUNITY): Payer: Medicare Other

## 2019-08-16 MED ORDER — NICOTINE 14 MG/24HR TD PT24
14.0000 mg | MEDICATED_PATCH | Freq: Every day | TRANSDERMAL | Status: DC
  Administered 2019-08-16 – 2019-08-19 (×4): 14 mg via TRANSDERMAL
  Filled 2019-08-16 (×4): qty 1

## 2019-08-16 MED ORDER — POLYETHYLENE GLYCOL 3350 17 G PO PACK
17.0000 g | PACK | Freq: Every day | ORAL | Status: DC
  Administered 2019-08-17 – 2019-08-19 (×3): 17 g via ORAL
  Filled 2019-08-16 (×4): qty 1

## 2019-08-16 MED ORDER — OXYCODONE HCL 5 MG PO TABS
5.0000 mg | ORAL_TABLET | ORAL | Status: DC | PRN
  Administered 2019-08-17 (×3): 10 mg via ORAL
  Administered 2019-08-17: 5 mg via ORAL
  Administered 2019-08-18 – 2019-08-19 (×6): 10 mg via ORAL
  Filled 2019-08-16 (×4): qty 2
  Filled 2019-08-16: qty 1
  Filled 2019-08-16 (×5): qty 2

## 2019-08-16 MED ORDER — HYDROMORPHONE HCL 1 MG/ML IJ SOLN
1.0000 mg | INTRAMUSCULAR | Status: DC | PRN
  Administered 2019-08-16 – 2019-08-17 (×3): 1 mg via INTRAVENOUS
  Filled 2019-08-16 (×3): qty 1

## 2019-08-16 MED ORDER — FENTANYL CITRATE (PF) 100 MCG/2ML IJ SOLN
INTRAMUSCULAR | Status: AC
  Administered 2019-08-16: 14:00:00 100 ug via INTRAVENOUS
  Filled 2019-08-16: qty 2

## 2019-08-16 MED ORDER — ENOXAPARIN SODIUM 40 MG/0.4ML ~~LOC~~ SOLN
40.0000 mg | SUBCUTANEOUS | Status: DC
  Administered 2019-08-16 – 2019-08-17 (×2): 40 mg via SUBCUTANEOUS
  Filled 2019-08-16 (×2): qty 0.4

## 2019-08-16 MED ORDER — IPRATROPIUM-ALBUTEROL 0.5-2.5 (3) MG/3ML IN SOLN
3.0000 mL | Freq: Four times a day (QID) | RESPIRATORY_TRACT | Status: DC | PRN
  Administered 2019-08-17: 04:00:00 3 mL via RESPIRATORY_TRACT
  Filled 2019-08-16: qty 3

## 2019-08-16 MED ORDER — GUAIFENESIN 200 MG PO TABS
200.0000 mg | ORAL_TABLET | Freq: Four times a day (QID) | ORAL | Status: DC
  Administered 2019-08-16 – 2019-08-19 (×12): 200 mg via ORAL
  Filled 2019-08-16 (×15): qty 1

## 2019-08-16 MED ORDER — FENTANYL CITRATE (PF) 100 MCG/2ML IJ SOLN: 100.0000 ug | Freq: Once | INTRAMUSCULAR | Status: AC

## 2019-08-16 MED ORDER — ONDANSETRON HCL 4 MG PO TABS: 4.0000 mg | ORAL_TABLET | Freq: Four times a day (QID) | ORAL | Status: DC | PRN

## 2019-08-16 MED ORDER — ACETAMINOPHEN 500 MG PO TABS
1000.0000 mg | ORAL_TABLET | Freq: Three times a day (TID) | ORAL | Status: DC
  Administered 2019-08-16 – 2019-08-18 (×6): 1000 mg via ORAL
  Filled 2019-08-16 (×7): qty 2

## 2019-08-16 MED ORDER — METHOCARBAMOL 500 MG PO TABS
500.0000 mg | ORAL_TABLET | Freq: Four times a day (QID) | ORAL | Status: DC
  Administered 2019-08-16 – 2019-08-17 (×4): 500 mg via ORAL
  Filled 2019-08-16 (×4): qty 1

## 2019-08-16 MED ORDER — DOCUSATE SODIUM 100 MG PO CAPS
100.0000 mg | ORAL_CAPSULE | Freq: Two times a day (BID) | ORAL | Status: DC
  Administered 2019-08-16 – 2019-08-19 (×6): 100 mg via ORAL
  Filled 2019-08-16 (×6): qty 1

## 2019-08-16 NOTE — ED Notes (Signed)
Called [facility] with bed assignment [facility]) and for transport.

## 2019-08-16 NOTE — H&P (Signed)
[facility] Surgery Consult Note  LORING LISKEY [birth year]  [id number].    Requesting MD: Carmin Muskrat Chief Complaint/Reason for Consult: fall  HPI:  Caleb Blake is a 54yo male who was transferred from [facility] ED to [facility] for admission to the trauma service after falling about 7 feet off the side of a deck. Denies hitting his head or LOC. Complaining only of pain in his right chest, associated with SOB due to the pain. Denies neck pain, abdominal pain, pelvic pain, BUE/BLE pain, or n/t or weakness in any extremity. Worked up by EDP and found to have acute fractures of nearly all right ribs, probable nondisplaced fractures of the right 3rd-5th transverse processes, and small right hydropneumothorax. Trauma asked to see for admission.  No significant PMH Anticoagulants: none Smokes 1 PPD Denies illicit drug use Drinks alcohol only occassionally Employment: not currently working Lives at home alone Allergic to penicillin  Review of Systems  Constitutional: Negative.   HENT: Negative.   Eyes: Negative.   Respiratory: Positive for cough and shortness of breath.   Cardiovascular: Positive for chest pain.  Gastrointestinal: Negative.   Genitourinary: Negative.   Musculoskeletal: Negative.   Skin: Negative.   Neurological: Negative.     All systems reviewed and otherwise negative except for as above  History reviewed. No pertinent family history.  Past Medical History:  Diagnosis Date  . Head injury     Past Surgical History:  Procedure Laterality Date  . ABDOMINAL SURGERY      Social History:  reports that he has been smoking cigarettes. He has been smoking about 0.50 packs per day. He has never used smokeless tobacco. He reports that he does not drink alcohol or use drugs.  Allergies:  Allergies  Allergen Reactions  . Penicillins     Unknown reaction     Medications Prior to Admission  Medication Sig Dispense Refill  . aspirin EC 81 MG tablet Take 81  mg by mouth daily.      Prior to Admission medications   Medication Sig Start Date End Date Taking? Authorizing Provider  aspirin EC 81 MG tablet Take 81 mg by mouth daily.   Yes [provider]    Blood pressure (!) 147/93, pulse 72, temperature 98.5 F (36.9 C), temperature source Oral, resp. rate 18, height 5\' 5"  (1.651 m), weight 65.8 kg, SpO2 91 %. Physical Exam: General: pleasant, WD/WN white male who is laying in bed in NAD HEENT: head is normocephalic, atraumatic.  Sclera are noninjected.  PERRL.  Ears and nose without any masses or lesions.  Mouth is pink and moist. Dentition fair Heart: regular, rate, and rhythm.  Normal s1,s2. No obvious murmurs, gallops, or rubs noted.  Palpable pedal pulses bilaterally  Lungs: diffuse rhonchi bilaterally, no wheezing, Respiratory effort nonlabored on room air Abd: well healed midline incision, soft, NT/ND, +BS, no masses, hernias, or organomegaly MS: no BUE/BLE edema, calves soft and nontender. No gross BUE/BLE deformity Skin: warm and dry with no masses, lesions, or rashes Psych: A&Ox4 with an appropriate affect Neuro: cranial nerves grossly intact, equal strength in BUE/BLE bilaterally, normal speech, thought process intact  Results for orders placed or performed during the hospital encounter of 08/15/19 (from the past 48 hour(s))  Respiratory Panel by RT PCR (Flu A&B, Covid) - Nasopharyngeal Swab     Status: None   Collection Time: 08/15/19  3:53 PM   Specimen: Nasopharyngeal Swab  Result Value Ref Range   SARS Coronavirus 2  by RT PCR NEGATIVE NEGATIVE    Comment: (NOTE) SARS-CoV-2 target nucleic acids are NOT DETECTED. The SARS-CoV-2 RNA is generally detectable in upper respiratoy specimens during the acute phase of infection. The lowest concentration of SARS-CoV-2 viral copies this assay can detect is 131 copies/mL. A negative result does not preclude SARS-Cov-2 infection and should not be used as the sole basis for  treatment or other patient management decisions. A negative result may occur with  improper specimen collection/handling, submission of specimen other than nasopharyngeal swab, presence of viral mutation(s) within the areas targeted by this assay, and inadequate number of viral copies (<131 copies/mL). A negative result must be combined with clinical observations, patient history, and epidemiological information. The expected result is Negative. Fact Sheet for Patients:  https://www.moore.com/ Fact Sheet for Healthcare Providers:  https://www.young.biz/ This test is not yet ap proved or cleared by the [place] FDA and  has been authorized for detection and/or diagnosis of SARS-CoV-2 by FDA under an Emergency Use Authorization (EUA). This EUA will remain  in effect (meaning this test can be used) for the duration of the COVID-19 declaration under Section 564(b)(1) of the Act, 21 U.S.C. section 360bbb-3(b)(1), unless the authorization is terminated or revoked sooner.    Influenza A by PCR NEGATIVE NEGATIVE   Influenza B by PCR NEGATIVE NEGATIVE    Comment: (NOTE) The Xpert Xpress SARS-CoV-2/FLU/RSV assay is intended as an aid in  the diagnosis of influenza from Nasopharyngeal swab specimens and  should not be used as a sole basis for treatment. Nasal washings and  aspirates are unacceptable for Xpert Xpress SARS-CoV-2/FLU/RSV  testing. Fact Sheet for Patients: https://www.moore.com/ Fact Sheet for Healthcare Providers: https://www.young.biz/ This test is not yet approved or cleared by the [place] FDA and  has been authorized for detection and/or diagnosis of SARS-CoV-2 by  FDA under an Emergency Use Authorization (EUA). This EUA will remain  in effect (meaning this test can be used) for the duration of the  Covid-19 declaration under Section 564(b)(1) of the Act, 21  U.S.C. section  360bbb-3(b)(1), unless the authorization is  terminated or revoked. Performed at [facility], [address], [place], [place] [address]   Basic metabolic panel     Status: Abnormal   Collection Time: 08/15/19  4:05 PM  Result Value Ref Range   Sodium 136 135 - 145 mmol/L   Potassium 4.1 3.5 - 5.1 mmol/L   Chloride 100 98 - 111 mmol/L   CO2 24 22 - 32 mmol/L   Glucose, Bld 108 (H) 70 - 99 mg/dL    Comment: Glucose reference range applies only to samples taken after fasting for at least 8 hours.   BUN 17 6 - 20 mg/dL   Creatinine, Ser 2.35 0.61 - 1.24 mg/dL   Calcium 9.2 8.9 - 36.1 mg/dL   GFR calc non Af Amer >60 >60 mL/min   GFR calc Af Amer >60 >60 mL/min   Anion gap 12 5 - 15    Comment: Performed at [facility], [address], [place], [place] [address]  CBC with Differential     Status: Abnormal   Collection Time: 08/15/19  4:05 PM  Result Value Ref Range   WBC 19.6 (H) 4.0 - 10.5 K/uL   RBC 4.73 4.22 - 5.81 MIL/uL   Hemoglobin 15.1 13.0 - 17.0 g/dL   HCT 40.0 86.7 - 61.9 %   MCV 95.1 80.0 - 100.0 fL   MCH 31.9 26.0 - 34.0 pg  MCHC 33.6 30.0 - 36.0 g/dL   RDW 37.9 02.4 - 09.7 %   Platelets 231 150 - 400 K/uL   nRBC 0.0 0.0 - 0.2 %   Neutrophils Relative % 89 %   Neutro Abs 17.5 (H) 1.7 - 7.7 K/uL   Lymphocytes Relative 4 %   Lymphs Abs 0.8 0.7 - 4.0 K/uL   Monocytes Relative 6 %   Monocytes Absolute 1.1 (H) 0.1 - 1.0 K/uL   Eosinophils Relative 0 %   Eosinophils Absolute 0.0 0.0 - 0.5 K/uL   Basophils Relative 0 %   Basophils Absolute 0.1 0.0 - 0.1 K/uL   Immature Granulocytes 1 %   Abs Immature Granulocytes 0.11 (H) 0.00 - 0.07 K/uL    Comment: Performed at [facility], [address] [place], [place] [address]  Protime-INR     Status: None   Collection Time: 08/15/19  4:05 PM  Result Value Ref Range   Prothrombin Time 12.2 11.4 - 15.2 seconds   INR 0.9 0.8 - 1.2    Comment: (NOTE) INR goal varies based on device and disease states. Performed at [facility], [address] [place], [place] [address]    CT Chest Wo Contrast  Result Date: 08/15/2019 CLINICAL DATA:  Fall, right flank and rib pain EXAM: CT CHEST WITHOUT CONTRAST TECHNIQUE: Multidetector CT imaging of the chest was performed following the standard protocol without IV contrast. COMPARISON:  None. FINDINGS: Cardiovascular: Normal heart size.  No pericardial effusion. Mediastinum/Nodes: No enlarged lymph nodes. Thyroid is unremarkable. Lungs/Pleura: Small right hydropneumothorax. Right lower lobe dependent atelectasis. Minimal left lower lobe dependent atelectasis. Upper Abdomen: Cholelithiasis. Musculoskeletal: Mild subcutaneous emphysema. Multiple rib fractures with partial imaging of the inferior right: Nondisplaced posterior right first rib Nondisplaced anterolateral right third and fourth ribs Variably displaced posterolateral fifth-tenth ribs. Nondisplaced posterior eleventh rib. Probable nondisplaced fractures of the right third through fifth transverse processes. IMPRESSION: Acute fractures of nearly all right ribs, which are partially imaged. There is variable displacement of the fifth-tenth ribs. Probable nondisplaced fractures of the right third-fifth transverse processes. Small right hydropneumothorax.  Bibasilar atelectasis. Electronically Signed   By: Guadlupe Spanish M.D.   On: 08/15/2019 17:12   DG Chest Port 1 View  Result Date: 08/15/2019 CLINICAL DATA:  Fall with possible rib fracture right. Rule out pneumothorax. Fall off deck. Severe right-sided pain. EXAM: PORTABLE CHEST 1 VIEW COMPARISON:  Radiograph 06/19/2011 FINDINGS: Multiple right rib fractures including ribs 4 through 9 posteriorly. No new these fractures are displaced. There is small amount of subcutaneous gas in the right chest wall. No visualized pneumothorax. Chronic elevation of right hemidiaphragm with adjacent scarring. The heart is normal in size with unchanged mediastinal contours. No evidence of acute left  rib fracture. No obvious scapular or shoulder girdle fracture. IMPRESSION: Multiple right rib fractures including ribs 4 through 9 posteriorly, many of which are displaced. No visualized pneumothorax. Small amount of subcutaneous gas in the right chest wall. Chest CT is planned. Electronically Signed   By: Narda Rutherford M.D.   On: 08/15/2019 16:01      Assessment/Plan Fall 7 feet from deck Multiple right rib fx with small R hydroPNX - Repeat CXR. Guaifenesin for phlegm, duonebs PRN. Pulm toilet and multimodal pain control (Schedule tylenol and robaxin, oxy PRN, dilaudid for breakthrough pain) Right 3-5 lumbar TP fxs - pain control Tobacco abuse - 1 PPD, add nicotine patch  ID - none VTE - SCDs, lovenox FEN - HH diet Foley -  none  Plan - Pain control and pulmonary toilet. Physical therapy consult. Likely ready for discharge 1-2 days  Franne Forts, [facility] Surgery 08/16/2019, 3:37 PM Please see Amion for pager number during day hours 7:00am-4:30pm

## 2019-08-16 NOTE — Progress Notes (Signed)
Pt arrived to [id number]. Trauma PA paged.

## 2019-08-16 NOTE — Progress Notes (Deleted)
   08/16/19 1508 08/16/19 1530  Assess: MEWS Score  Temp 98.5 F (36.9 C)  --   BP (!) 147/93  --   Pulse Rate 72  --   Resp 18  --   Level of Consciousness  --  Alert  SpO2 91 %  --   O2 Device Room Air  --   Assess: MEWS Score  MEWS Temp 0 0  MEWS Systolic 0 0  MEWS Pulse 0 0  MEWS RR 0 0  MEWS LOC 0 0  MEWS Score 0 0  MEWS Score Color Riki Sheer

## 2019-08-16 NOTE — ED Notes (Signed)
Report given to [facility] 

## 2019-08-17 LAB — BASIC METABOLIC PANEL
Anion gap: 7 (ref 5–15)
BUN: 17 mg/dL (ref 6–20)
CO2: 27 mmol/L (ref 22–32)
Calcium: 8.8 mg/dL — ABNORMAL LOW (ref 8.9–10.3)
Chloride: 102 mmol/L (ref 98–111)
Creatinine, Ser: 1.11 mg/dL (ref 0.61–1.24)
GFR calc Af Amer: >60 mL/min (ref >60)
GFR calc non Af Amer: >60 mL/min (ref >60)
Glucose, Bld: 123 mg/dL — ABNORMAL HIGH (ref 70–99)
Potassium: 4.5 mmol/L (ref 3.5–5.1)
Sodium: 136 mmol/L (ref 135–145)

## 2019-08-17 LAB — CBC
HCT: 42.2 % (ref 39.0–52.0)
Hemoglobin: 14 g/dL (ref 13.0–17.0)
MCH: 31.8 pg (ref 26.0–34.0)
MCHC: 33.2 g/dL (ref 30.0–36.0)
MCV: 95.9 fL (ref 80.0–100.0)
Platelets: 189 10*3/uL (ref 150–400)
RBC: 4.4 MIL/uL (ref 4.22–5.81)
RDW: 12.6 % (ref 11.5–15.5)
WBC: 16.3 10*3/uL — ABNORMAL HIGH (ref 4.0–10.5)
nRBC: 0 % (ref 0.0–0.2)

## 2019-08-17 MED ORDER — LIDOCAINE 5 % EX PTCH
1.0000 | MEDICATED_PATCH | Freq: Every day | CUTANEOUS | Status: DC
  Administered 2019-08-17 – 2019-08-19 (×3): 1 via TRANSDERMAL
  Filled 2019-08-17 (×3): qty 1

## 2019-08-17 MED ORDER — METHOCARBAMOL 500 MG PO TABS
750.0000 mg | ORAL_TABLET | Freq: Four times a day (QID) | ORAL | Status: DC
  Administered 2019-08-17 – 2019-08-18 (×4): 750 mg via ORAL
  Filled 2019-08-17 (×4): qty 2

## 2019-08-17 MED ORDER — HYDROMORPHONE HCL 1 MG/ML IJ SOLN: 1.0000 mg | INTRAMUSCULAR | Status: DC | PRN

## 2019-08-17 MED ORDER — KETOROLAC TROMETHAMINE 15 MG/ML IJ SOLN
15.0000 mg | Freq: Four times a day (QID) | INTRAMUSCULAR | Status: DC | PRN
  Administered 2019-08-17 – 2019-08-18 (×2): 15 mg via INTRAVENOUS
  Filled 2019-08-17 (×2): qty 1

## 2019-08-17 NOTE — Plan of Care (Signed)
  Problem: Coping: Goal: Level of anxiety will decrease Outcome: Progressing   Problem: Pain Managment: Goal: General experience of comfort will improve Outcome: Progressing   

## 2019-08-17 NOTE — TOC Initial Note (Signed)
Transition of Care [facility]) - Initial/Assessment Note    Patient Details  Name: Caleb Blake MRN: [id number] Date of Birth: 1965/09/07  [facility] CM/SW Contact:    Emeterio Reeve, [place] Phone Number: 08/17/2019, 10:57 AM  Clinical Narrative:                  CSW met with pt at bedside. CSW introduced self and explained her role at the hospital. Pt stated that PTA he lives at home alone. Pt stated that he was completely independent. Pt states he has a brother that lives close to him.  Pt states he was just seen by PT/OT. CSW explained that once they make reccomendations csw will come back and discuss his options for discharge. Pt stated that he knows that he would rather go home and that his brother will let him stay at his house for a while and he would be able to provide the support that he needs.   CSW completed sbirt with pt. Pt scored a 2 on the sbirt scale. Pt stated that he may have a beer a couple times a month. Pt denied substance use. CSW offered resources and pt stated he did not need resources.   Expected Discharge Plan: [place] Barriers to Discharge: Continued Medical Work up   Patient Goals and CMS Choice Patient states their goals for this hospitalization and ongoing recovery are:: "To go back home"      Expected Discharge Plan and Services Expected Discharge Plan: [place]       Living arrangements for the past 2 months: Single Family Home                                      Prior Living Arrangements/Services Living arrangements for the past 2 months: Single Family Home Lives with:: Self Patient language and need for interpreter reviewed:: Yes Do you feel safe going back to the place where you live?: Yes      Need for Family Participation in Patient Care: Yes (Comment) Care giver support system in place?: Yes (comment)   Criminal Activity/Legal Involvement Pertinent to Current  Situation/Hospitalization: No - Comment as needed  Activities of Daily Living Home Assistive Devices/Equipment: None ADL Screening (condition at time of admission) Patient's cognitive ability adequate to safely complete daily activities?: Yes Is the patient deaf or have difficulty hearing?: No Does the patient have difficulty seeing, even when wearing glasses/contacts?: No Does the patient have difficulty concentrating, remembering, or making decisions?: No Patient able to express need for assistance with ADLs?: Yes Does the patient have difficulty dressing or bathing?: No Independently performs ADLs?: Yes (appropriate for developmental age) Does the patient have difficulty walking or climbing stairs?: No Weakness of Legs: None Weakness of Arms/Hands: Right  Permission Sought/Granted                  Emotional Assessment Appearance:: Appears stated age Attitude/Demeanor/Rapport: Engaged Affect (typically observed): Appropriate Orientation: : Oriented to Self, Oriented to  Time, Oriented to Place, Oriented to Situation Alcohol / Substance Use: Not Applicable Psych Involvement: No (comment)  Admission diagnosis:  Trauma [T14.90XA] Fall, initial encounter [W19.XXXA] Closed fracture of multiple ribs of right side, initial encounter [S22.41XA] Closed fracture of multiple thoracic vertebrae, initial encounter [facility]) [S22.009A] Patient Active Problem List   Diagnosis Date Noted  . Trauma 08/15/2019  PCP:  Patient, No Pcp Per Pharmacy:   [place], [place] [address] [place] [address] Phone: [phone] Fax: [phone]     Social Determinants of Health (SDOH) Interventions    Readmission Risk Interventions No flowsheet data found.  Emeterio Reeve, Latanya Presser, [place] Social Worker [phone]

## 2019-08-17 NOTE — Evaluation (Signed)
Physical Therapy Evaluation Patient Details Name: Caleb Caleb MRN: [id number] DOB: [birth year] Today's Date: 08/17/2019   History of Present Illness  Patient is a 54 y/o male who presents with ribs fxs, L3-5 TP fxs, right hydroPTX after falling from 7' deck. PMH includes head injury.  Clinical Impression  Patient presents with pain, decreased activity tolerance and impaired mobility s/p above. Pt lives alone and independent PTA. Manages properties part time. Today, pt requires Min A for bed mobility and transfers and Min guard assist for ambulation with use of RW. Sp02 stayed >90% on 2L/min 02 [place] with difficulty taking deep breaths. Education re: bracing with pillow, importance of IS and mobility. Pt plans to possibly stay with his brother at dc. Anticipate pt's mobility with improve with better pain control and increased activity. Will follow acutely to maximize independence and mobility prior to return home.    Follow Up Recommendations No PT follow up;Supervision - Intermittent    Equipment Recommendations  None recommended by PT    Recommendations for Other Services       Precautions / Restrictions Precautions Precautions: Fall Precaution Comments: watch 02 Restrictions Weight Bearing Restrictions: No      Mobility  Bed Mobility Overal bed mobility: Needs Assistance Bed Mobility: Rolling;Sidelying to Sit Rolling: Mod assist Sidelying to sit: Min assist;HOB elevated       General bed mobility comments: Assist with rolling and to elevate trunk tog et to EOB with use of pad; increased effort/time due to pain. Cues for log roll technique.  Transfers Overall transfer level: Needs assistance Equipment used: Rolling walker (2 wheeled) Transfers: Sit to/from Stand Sit to Stand: Min assist         General transfer comment: Min A to power to standing with cues for hand placement. Stood from [employer], transferred to chair post ambulation.  Ambulation/Gait Ambulation/Gait  assistance: Min guard Gait Distance (Feet): 10 Feet Assistive device: Rolling walker (2 wheeled) Gait Pattern/deviations: Step-through pattern;Decreased stride length Gait velocity: decreased   General Gait Details: Slow, guarded gait with use of RW. Sp02 90% on 2L/min 02 [place].  Stairs            Wheelchair Mobility    Modified Rankin (Stroke Patients Only)       Balance Overall balance assessment: Needs assistance Sitting-balance support: Feet supported;No upper extremity supported Sitting balance-Leahy Scale: Fair     Standing balance support: During functional activity Standing balance-Leahy Scale: Poor Standing balance comment: requires at least 1 Ue support in standing.                             Pertinent Vitals/Pain Pain Assessment: Faces Faces Pain Scale: Hurts whole lot Pain Location: right side Pain Descriptors / Indicators: Sharp;Sore Pain Intervention(s): Repositioned;Monitored during session;Limited activity within patient's tolerance    Home Living Family/patient expects to be discharged to:: Private residence Living Arrangements: Alone Available Help at Discharge: Family;Available PRN/intermittently Type of Home: House Home Access: Level entry     Home Layout: One level Home Equipment: Grab bars - tub/shower;Walker - 2 wheels;Crutches      Prior Function Level of Independence: Independent         Comments: Drives, cooks, cleans. brother lives close by and might be able to stay with him.     Hand Dominance   Dominant Hand: Right    Extremity/Trunk Assessment   Upper Extremity Assessment Upper Extremity Assessment: Defer to OT evaluation  Lower Extremity Assessment Lower Extremity Assessment: Overall WFL for tasks assessed       Communication   Communication: No difficulties  Cognition Arousal/Alertness: Awake/alert Behavior During Therapy: WFL for tasks assessed/performed Overall Cognitive Status: Within  Functional Limits for tasks assessed                                        General Comments      Exercises     Assessment/Plan    PT Assessment Patient needs continued PT services  PT Problem List Decreased mobility;Decreased knowledge of precautions;Decreased activity tolerance;Pain;Cardiopulmonary status limiting activity       PT Treatment Interventions Therapeutic activities;Gait training;Therapeutic exercise;Patient/family education;Balance training;Stair training;Functional mobility training;DME instruction    PT Goals (Current goals can be found in the Care Plan section)  Acute Rehab PT Goals Patient Stated Goal: to get this mucus up PT Goal Formulation: With patient Time For Goal Achievement: 08/31/19 Potential to Achieve Goals: Good    Frequency Min 5X/week   Barriers to discharge Decreased caregiver support lives alone    Co-evaluation               AM-PAC PT "6 Clicks" Mobility  Outcome Measure Help needed turning from your back to your side while in a flat bed without using bedrails?: A Little Help needed moving from lying on your back to sitting on the side of a flat bed without using bedrails?: A Lot Help needed moving to and from a bed to a chair (including a wheelchair)?: A Little Help needed standing up from a chair using your arms (e.g., wheelchair or bedside chair)?: A Little Help needed to walk in hospital room?: A Little Help needed climbing 3-5 steps with a railing? : A Little 6 Click Score: 17    End of Session Equipment Utilized During Treatment: Gait belt Activity Tolerance: Patient tolerated treatment well Patient left: in chair;with call bell/phone within reach Nurse Communication: Mobility status PT Visit Diagnosis: Pain;Difficulty in walking, not elsewhere classified (R26.2) Pain - Right/Left: Right Pain - part of body: (side of body)    Time: 5520-8022 PT Time Calculation (min) (ACUTE ONLY): 38  min   Charges:   PT Evaluation $PT Eval Moderate Complexity: 1 Mod PT Treatments $Gait Training: 8-22 mins $Therapeutic Activity: 8-22 mins        Vale Haven, PT, DPT Acute Rehabilitation Services Pager [phone] Office [phone]      Caleb Caleb 08/17/2019, 1:54 PM

## 2019-08-17 NOTE — Progress Notes (Signed)
[facility] Progress Note     Subjective: CC-  Up in chair, just worked with PT. States that his ribs started hurting more over night. Required several doses of IV dilaudid for breakthrough pain. O2 sats low 90's on 1L [place]. Pulling up to 500 on IS. Thinks he would feels better if he could get the phlegm out.  Poor appetite but tolerating what he is eating. Denies n/v. Feels like he may have a BM today. Thinks he will plan to stay with his brother for a while after discharge.  Objective: Vital signs in last 24 hours: Temp:  [98.5 F (36.9 C)-98.9 F (37.2 C)] 98.7 F (37.1 C) (04/30 0901) Pulse Rate:  [61-81] 81 (04/30 0901) Resp:  [14-25] 18 (04/30 0901) BP: (128-147)/(80-96) 128/80 (04/30 0901) SpO2:  [90 %-98 %] 93 % (04/30 0901) Last BM Date: 08/15/19  Intake/Output from previous day: 04/29 0701 - 04/30 0700 In: 480 [P.O.:480] Out: -  Intake/Output this shift: No intake/output data recorded.  PE: Gen:  Alert, NAD, pleasant HEENT: EOM's intact, pupils equal and round Card:  RRR Pulm:  CTAB on anterior exam, no W/R/R, cannot take deep breaths, O2 sats low 90's on 2L  >> decreased to 1L and O2 sats remained the same Abd: Soft, NT/ND, +BS Psych: A&Ox3 Skin: no rashes noted, warm and dry  Lab Results:  Recent Labs    08/15/19 1605 08/17/19 0303  WBC 19.6* 16.3*  HGB 15.1 14.0  HCT 45.0 42.2  PLT 231 189   BMET Recent Labs    08/15/19 1605 08/17/19 0303  NA 136 136  K 4.1 4.5  CL 100 102  CO2 24 27  GLUCOSE 108* 123*  BUN 17 17  CREATININE 1.01 1.11  CALCIUM 9.2 8.8*   PT/INR Recent Labs    08/15/19 1605  LABPROT 12.2  INR 0.9   CMP     Component Value Date/Time   NA 136 08/17/2019 0303   K 4.5 08/17/2019 0303   CL 102 08/17/2019 0303   CO2 27 08/17/2019 0303   GLUCOSE 123 (H) 08/17/2019 0303   BUN 17 08/17/2019 0303   CREATININE 1.11 08/17/2019 0303   CALCIUM 8.8 (L) 08/17/2019 0303   GFRNONAA >60 08/17/2019 0303   GFRAA >60  08/17/2019 0303   Lipase  No results found for: LIPASE     Studies/Results: CT Chest Wo Contrast  Result Date: 08/15/2019 CLINICAL DATA:  Fall, right flank and rib pain EXAM: CT CHEST WITHOUT CONTRAST TECHNIQUE: Multidetector CT imaging of the chest was performed following the standard protocol without IV contrast. COMPARISON:  None. FINDINGS: Cardiovascular: Normal heart size.  No pericardial effusion. Mediastinum/Nodes: No enlarged lymph nodes. Thyroid is unremarkable. Lungs/Pleura: Small right hydropneumothorax. Right lower lobe dependent atelectasis. Minimal left lower lobe dependent atelectasis. Upper Abdomen: Cholelithiasis. Musculoskeletal: Mild subcutaneous emphysema. Multiple rib fractures with partial imaging of the inferior right: Nondisplaced posterior right first rib Nondisplaced anterolateral right third and fourth ribs Variably displaced posterolateral fifth-tenth ribs. Nondisplaced posterior eleventh rib. Probable nondisplaced fractures of the right third through fifth transverse processes. IMPRESSION: Acute fractures of nearly all right ribs, which are partially imaged. There is variable displacement of the fifth-tenth ribs. Probable nondisplaced fractures of the right third-fifth transverse processes. Small right hydropneumothorax.  Bibasilar atelectasis. Electronically Signed   By: Macy Mis M.D.   On: 08/15/2019 17:12   DG CHEST PORT 1 VIEW  Result Date: 08/16/2019 CLINICAL DATA:  Multiple right rib fractures. Fall 10 feet from  a deck. New cough. EXAM: PORTABLE CHEST 1 VIEW COMPARISON:  Radiograph and CT yesterday. FINDINGS: Multiple right rib fractures again seen. There is elevation of right hemidiaphragm with adjacent patchy basilar opacity. Left pleural effusion, slightly worsened. No evidence of pneumothorax. Left lung is clear. Unchanged heart size and mediastinal contours. IMPRESSION: 1. Slight increased right pleural effusion and adjacent patchy opacity in the right  lung base since imaging yesterday. 2. Multiple right rib fractures are again seen. No pneumothorax. Electronically Signed   By: Narda Rutherford M.D.   On: 08/16/2019 16:25   DG Chest Port 1 View  Result Date: 08/15/2019 CLINICAL DATA:  Fall with possible rib fracture right. Rule out pneumothorax. Fall off deck. Severe right-sided pain. EXAM: PORTABLE CHEST 1 VIEW COMPARISON:  Radiograph 06/19/2011 FINDINGS: Multiple right rib fractures including ribs 4 through 9 posteriorly. No new these fractures are displaced. There is small amount of subcutaneous gas in the right chest wall. No visualized pneumothorax. Chronic elevation of right hemidiaphragm with adjacent scarring. The heart is normal in size with unchanged mediastinal contours. No evidence of acute left rib fracture. No obvious scapular or shoulder girdle fracture. IMPRESSION: Multiple right rib fractures including ribs 4 through 9 posteriorly, many of which are displaced. No visualized pneumothorax. Small amount of subcutaneous gas in the right chest wall. Chest CT is planned. Electronically Signed   By: Narda Rutherford M.D.   On: 08/15/2019 16:01    Anti-infectives: Anti-infectives (From admission, onward)   None       Assessment/Plan Fall 7 feet from deck Multiple right rib fx with small R hydroPNX - Repeat CXR stable. Pain control and pulm toilet Right 3-5 lumbar TP fxs - pain control Tobacco abuse - 1 PPD, nicotine patch  ID - none VTE - SCDs, lovenox FEN - HH diet F[place] - none Follow up - does not have PCP but has Medicare - will contact his insurance to establish  Plan - Follow up PT recs. Increased robaxin and added toradol PRN, lidocaine patch for better pain control. Continue scheduled guaifenesin and add flutter valve for phlegm. Wean to room air. May be ready for discharge over the week if pain better controlled, off oxygen, and doing better on IS. Plans to stay with brother at discharge.   LOS: 2 days    Franne Forts, [facility] 08/17/2019, 10:21 AM Please see Amion for pager number during day hours 7:00am-4:30pm

## 2019-08-18 LAB — CBC
HCT: 38.4 % — ABNORMAL LOW (ref 39.0–52.0)
Hemoglobin: 12.8 g/dL — ABNORMAL LOW (ref 13.0–17.0)
MCH: 31.8 pg (ref 26.0–34.0)
MCHC: 33.3 g/dL (ref 30.0–36.0)
MCV: 95.3 fL (ref 80.0–100.0)
Platelets: 169 10*3/uL (ref 150–400)
RBC: 4.03 MIL/uL — ABNORMAL LOW (ref 4.22–5.81)
RDW: 12.2 % (ref 11.5–15.5)
WBC: 13.8 10*3/uL — ABNORMAL HIGH (ref 4.0–10.5)
nRBC: 0 % (ref 0.0–0.2)

## 2019-08-18 LAB — BASIC METABOLIC PANEL WITH GFR
Anion gap: 8 (ref 5–15)
BUN: 19 mg/dL (ref 6–20)
CO2: 26 mmol/L (ref 22–32)
Calcium: 8.5 mg/dL — ABNORMAL LOW (ref 8.9–10.3)
Chloride: 101 mmol/L (ref 98–111)
Creatinine, Ser: 1.1 mg/dL (ref 0.61–1.24)
GFR calc Af Amer: >60 mL/min
GFR calc non Af Amer: >60 mL/min
Glucose, Bld: 113 mg/dL — ABNORMAL HIGH (ref 70–99)
Potassium: 4.1 mmol/L (ref 3.5–5.1)
Sodium: 135 mmol/L (ref 135–145)

## 2019-08-18 MED ORDER — ACETAMINOPHEN 500 MG PO TABS
1000.0000 mg | ORAL_TABLET | Freq: Four times a day (QID) | ORAL | Status: DC
  Administered 2019-08-18 – 2019-08-19 (×4): 1000 mg via ORAL
  Filled 2019-08-18 (×4): qty 2

## 2019-08-18 MED ORDER — ENOXAPARIN SODIUM 30 MG/0.3ML ~~LOC~~ SOLN
30.0000 mg | Freq: Two times a day (BID) | SUBCUTANEOUS | Status: DC
  Administered 2019-08-18 – 2019-08-19 (×2): 30 mg via SUBCUTANEOUS
  Filled 2019-08-18 (×2): qty 0.3

## 2019-08-18 MED ORDER — KETOROLAC TROMETHAMINE 15 MG/ML IJ SOLN
15.0000 mg | Freq: Four times a day (QID) | INTRAMUSCULAR | Status: DC
  Administered 2019-08-18 – 2019-08-19 (×3): 15 mg via INTRAVENOUS
  Filled 2019-08-18 (×3): qty 1

## 2019-08-18 MED ORDER — METHOCARBAMOL 500 MG PO TABS
1000.0000 mg | ORAL_TABLET | Freq: Four times a day (QID) | ORAL | Status: DC
  Administered 2019-08-18 – 2019-08-19 (×3): 1000 mg via ORAL
  Filled 2019-08-18 (×3): qty 2

## 2019-08-18 NOTE — Plan of Care (Signed)
  Problem: Education: Goal: Knowledge of General Education information will improve Description: Including pain rating scale, medication(s)/side effects and non-pharmacologic comfort measures Outcome: Progressing   Problem: Health Behavior/Discharge Planning: Goal: Ability to manage health-related needs will improve Outcome: Progressing   Problem: Activity: Goal: Risk for activity intolerance will decrease Outcome: Progressing   Problem: Coping: Goal: Level of anxiety will decrease Outcome: Progressing   Problem: Elimination: Goal: Will not experience complications related to bowel motility Outcome: Progressing   Problem: Pain Managment: Goal: General experience of comfort will improve Outcome: Progressing   

## 2019-08-18 NOTE — Progress Notes (Signed)
Physical Therapy Treatment Patient Details Name: Caleb Blake MRN: [id number] DOB: Nov 02, 1965 Today's Date: 08/18/2019    History of Present Illness Patient is a 54 y/o male who presents with ribs fxs, L3-5 TP fxs, right hydroPTX after falling from 7' deck. PMH includes head injury.    PT Comments    Pt seated on commode in bathroom on arrival on RA.  Performed tx without O2 as he was not on it during entry.  Pt continues to destaurate with activity to 87%, reapplied  2L [place] and O2 sats improved to 91%.  Pt remains limited due to pain but tolerated significant increase in activity this afternoon.    Follow Up Recommendations  No PT follow up;Supervision - Intermittent     Equipment Recommendations  Rolling walker with 5" wheels    Recommendations for Other Services       Precautions / Restrictions Precautions Precautions: Fall Precaution Comments: watch 02 Restrictions Weight Bearing Restrictions: No    Mobility  Bed Mobility Overal bed mobility: Needs Assistance Bed Mobility: Rolling;Sit to Sidelying   Sidelying to sit: Supervision;HOB elevated       General bed mobility comments: Pt seated on commode on arrival.  He required close supervision for safety to maintain log rolling to return back to bed.  Transfers Overall transfer level: Needs assistance Equipment used: Rolling walker (2 wheeled) Transfers: Sit to/from Stand Sit to Stand: Min assist         General transfer comment: Min assistance to power into standing with cues for hand placement.  Stood from commode.  Once in standing required total assistance for pericare.  Ambulation/Gait Ambulation/Gait assistance: Min guard;Min assist Gait Distance (Feet): 300 Feet Assistive device: Rolling walker (2 wheeled) Gait Pattern/deviations: Step-through pattern;Decreased stride length Gait velocity: decreased   General Gait Details: Slow guarded gt with RW.  Min guard to min assistance for core stability.  Pt  on RA on arrival so no O2 utilized.  Post gt SPO2 87%, reapplied 2L and improved to 91%.   Stairs             Wheelchair Mobility    Modified Rankin (Stroke Patients Only)       Balance Overall balance assessment: Needs assistance Sitting-balance support: Feet supported;No upper extremity supported Sitting balance-Leahy Scale: Fair     Standing balance support: During functional activity Standing balance-Leahy Scale: Fair Standing balance comment: requires at least 1 Ue support in standing.                            Cognition Arousal/Alertness: Awake/alert Behavior During Therapy: WFL for tasks assessed/performed Overall Cognitive Status: Within Functional Limits for tasks assessed                                        Exercises Other Exercises Other Exercises: Incentive spirometer x10 reps quality    General Comments        Pertinent Vitals/Pain Pain Assessment: Faces Faces Pain Scale: Hurts whole lot Pain Location: right side of back and rib cage Pain Descriptors / Indicators: Sharp;Sore Pain Intervention(s): Monitored during session;Repositioned    Home Living                      Prior Function            PT Goals (current  goals can now be found in the care plan section) Acute Rehab PT Goals Patient Stated Goal: to get this mucus up Potential to Achieve Goals: Good Progress towards PT goals: Progressing toward goals    Frequency    Min 5X/week      PT Plan Current plan remains appropriate    Co-evaluation              AM-PAC PT "6 Clicks" Mobility   Outcome Measure  Help needed turning from your back to your side while in a flat bed without using bedrails?: A Little Help needed moving from lying on your back to sitting on the side of a flat bed without using bedrails?: A Little Help needed moving to and from a bed to a chair (including a wheelchair)?: A Little Help needed standing up  from a chair using your arms (e.g., wheelchair or bedside chair)?: A Little Help needed to walk in hospital room?: A Little Help needed climbing 3-5 steps with a railing? : A Little 6 Click Score: 18    End of Session   Activity Tolerance: Patient tolerated treatment well Patient left: in bed;with bed alarm set Nurse Communication: Mobility status PT Visit Diagnosis: Pain;Difficulty in walking, not elsewhere classified (R26.2) Pain - Right/Left: Right Pain - part of body: (side of body)     Time: 9371-6967 PT Time Calculation (min) (ACUTE ONLY): 18 min  Charges:  $Gait Training: 8-22 mins                     Erasmo Leventhal , PTA Acute Rehabilitation Services Pager [phone] Office [phone]     Milynn Quirion Eli Hose 08/18/2019, 4:17 PM

## 2019-08-18 NOTE — Progress Notes (Signed)
   Trauma/Critical Care Follow Up Note  Subjective:    Overnight Issues:   Objective:  Vital signs for last 24 hours: Temp:  [98 F (36.7 C)-98.7 F (37.1 C)] 98 F (36.7 C) (05/01 0443) Pulse Rate:  [72-77] 77 (05/01 0443) Resp:  [16-18] 16 (05/01 0443) BP: (110-126)/(70-89) 126/89 (05/01 0443) SpO2:  [91 %-92 %] 92 % (05/01 0443)  Hemodynamic parameters for last 24 hours:    Intake/Output from previous day: 04/30 0701 - 05/01 0700 In: 200 [P.O.:200] Out: 900 [Urine:900]  Intake/Output this shift: Total I/O In: 120 [P.O.:120] Out: -   Vent settings for last 24 hours:    Physical Exam:  Gen: comfortable, no distress Neuro: non-focal exam HEENT: PERRL Neck: supple CV: RRR Pulm: unlabored breathing Abd: soft, NT GU: clear yellow urine Extr: wwp, no edema   Results for orders placed or performed during the hospital encounter of 08/15/19 (from the past 24 hour(s))  CBC     Status: Abnormal   Collection Time: 08/18/19  3:41 AM  Result Value Ref Range   WBC 13.8 (H) 4.0 - 10.5 K/uL   RBC 4.03 (L) 4.22 - 5.81 MIL/uL   Hemoglobin 12.8 (L) 13.0 - 17.0 g/dL   HCT 56.3 (L) 87.5 - 64.3 %   MCV 95.3 80.0 - 100.0 fL   MCH 31.8 26.0 - 34.0 pg   MCHC 33.3 30.0 - 36.0 g/dL   RDW 32.9 51.8 - 84.1 %   Platelets 169 150 - 400 K/uL   nRBC 0.0 0.0 - 0.2 %  Basic metabolic panel     Status: Abnormal   Collection Time: 08/18/19  3:41 AM  Result Value Ref Range   Sodium 135 135 - 145 mmol/L   Potassium 4.1 3.5 - 5.1 mmol/L   Chloride 101 98 - 111 mmol/L   CO2 26 22 - 32 mmol/L   Glucose, Bld 113 (H) 70 - 99 mg/dL   BUN 19 6 - 20 mg/dL   Creatinine, Ser 6.60 0.61 - 1.24 mg/dL   Calcium 8.5 (L) 8.9 - 10.3 mg/dL   GFR calc non Af Amer >60 >60 mL/min   GFR calc Af Amer >60 >60 mL/min   Anion gap 8 5 - 15    Assessment & Plan: The plan of care was discussed with the bedside nurse for the day who is in agreement with this plan and no additional concerns were raised.    Present on Admission: . Trauma    LOS: 3 days   Additional comments:I reviewed the patient's new clinical lab test results.   and I reviewed the patients new imaging test results.    Fall 7 feet from deck  Multiple right rib fx with small R hydroPNX - Repeat CXR stable. Pain control and pulm toilet. Wean O2 as tolerated, encouraged ambulation. Right 3-5 lumbar TPfxs- pain control Tobacco abuse- 1 PPD, nicotine patch VTE -SCDs, lovenox FEN -HH diet Follow up - does not have PCP but has Medicare - will need to contact his insurance to establish    Diamantina Monks, MD Trauma & General Surgery Please use [email or web address] to contact on call provider  08/18/2019  *Care during the described time interval was provided by me. I have reviewed this patient's available data, including medical history, events of note, physical examination and test results as part of my evaluation.

## 2019-08-18 NOTE — Progress Notes (Signed)
Called by RN to give PRN breathing tx for wheezing. Upon arrival to pt room, pt in no distress with his oxygen off. Breath sounds with rhonchi bilaterally and pt with good cough. Pt instructed to continue working on Incentive Spirometry and PEP therapies. Pt verbalizes understanding and no needs at this time. RT will continue to monitor.

## 2019-08-19 ENCOUNTER — Other Ambulatory Visit: Payer: Self-pay | Admitting: Surgery

## 2019-08-19 MED ORDER — OXYCODONE HCL 5 MG PO TABS: 5.0000 mg | ORAL_TABLET | Freq: Four times a day (QID) | ORAL | 0 refills | Status: DC | PRN

## 2019-08-19 NOTE — Progress Notes (Signed)
   Trauma/Critical Care Follow Up Note  Subjective:    Overnight Issues:   Objective:  Vital signs for last 24 hours: Temp:  [97.6 F (36.4 C)-98.5 F (36.9 C)] 98 F (36.7 C) (05/02 0830) Pulse Rate:  [70-90] 74 (05/02 0830) Resp:  [16-17] 17 (05/02 0830) BP: (111-129)/(78-87) 119/78 (05/02 0830) SpO2:  [88 %-96 %] 95 % (05/02 0830)  Hemodynamic parameters for last 24 hours:    Intake/Output from previous day: 05/01 0701 - 05/02 0700 In: 480 [P.O.:480] Out: -   Intake/Output this shift: Total I/O In: 240 [P.O.:240] Out: -   Vent settings for last 24 hours:    Physical Exam:  Gen: comfortable, no distress Neuro: non-focal exam HEENT: PERRL Neck: supple CV: RRR Pulm: unlabored breathing Abd: soft, NT GU: clear yellow urine Extr: wwp, no edema   No results found for this or any previous visit (from the past 24 hour(s)).  Assessment & Plan: Present on Admission: . Trauma    LOS: 4 days   Additional comments:I reviewed the patient's new clinical lab test results.   and I reviewed the patients new imaging test results.     Fall 7 feet from deck  Multiple right rib fx with small R hydroPNX - Repeat CXRstable. Pain control and pulm toilet. Wean O2 as tolerated, encouraged ambulation. Right 3-5 lumbar TPfxs- pain control Tobacco abuse- 1 PPD, nicotine patch VTE -SCDs, lovenox FEN -HH diet Follow up - does not have PCP but has Medicare - will need to contact his insurance to establish Dispo - home today after ambulation and weaning O2 off   Diamantina Monks, MD Trauma & General Surgery Please use [email or web address] to contact on call provider  08/19/2019  *Care during the described time interval was provided by me. I have reviewed this patient's available data, including medical history, events of note, physical examination and test results as part of my evaluation.

## 2019-08-19 NOTE — Progress Notes (Signed)
Received call from Dr. Corliss Skains, who will reconcile discharge medications and instructions for patient.

## 2019-08-19 NOTE — Progress Notes (Signed)
Patient discharged to private residence with brother via private vehicle.  Escorted to exit via wheelchair by nurse tech.

## 2019-08-19 NOTE — Progress Notes (Signed)
Call made to [facility] answering service.  Received no call from PAs on call in order to reconsile discharge instructions.  Awaiting return call.

## 2019-08-19 NOTE — Progress Notes (Signed)
Discharge instructions reviewed with patient.  These included, but were not limited to, the following:  Discharge medications, follow-up appointments, when to call the MD, importance of pulmonary toileting, smoking cessation tips, pain management, etc.  Comprehension of information ascertained via "teach-back" method.

## 2019-08-19 NOTE — Progress Notes (Addendum)
Physical Therapy Treatment Patient Details Name: Caleb Blake MRN: [id number] DOB: [birth year] Today's Date: 08/19/2019    History of Present Illness Patient is a 54 y/o male who presents with ribs fxs, L3-5 TP fxs, right hydroPTX after falling from 7' deck. PMH includes head injury.    PT Comments    Pt received in bed on RA with SpO2 97%. He required min assist bed mobility, min assist sit to stand, and min guard assist ambulation 350' with RW. Desat to 87% on RA during ambulation. Improved to 91% on RA after 2-minute recovery seated in recliner. Pt remained on RA at end of session in recliner with feet elevated. Pt plans to d/c home with his brother.    Follow Up Recommendations  No PT follow up;Supervision - Intermittent     Equipment Recommendations  Rolling walker with 5" wheels    Recommendations for Other Services       Precautions / Restrictions Precautions Precautions: Fall;Other (comment) Precaution Comments: watch 02    Mobility  Bed Mobility Overal bed mobility: Needs Assistance Bed Mobility: Rolling;Sidelying to Sit Rolling: Supervision Sidelying to sit: Min assist       General bed mobility comments: cues for sequencing, assist to elevate trunk, +rail  Transfers Overall transfer level: Needs assistance Equipment used: Rolling walker (2 wheeled) Transfers: Sit to/from Stand Sit to Stand: Min assist         General transfer comment: cues for hand placement, increased time, assist to power up  Ambulation/Gait Ambulation/Gait assistance: Min guard Gait Distance (Feet): 350 Feet Assistive device: Rolling walker (2 wheeled) Gait Pattern/deviations: Step-through pattern;Decreased stride length Gait velocity: WFL Gait velocity interpretation: >2.62 ft/sec, indicative of community ambulatory General Gait Details: Mildly unsteady gait with RW. No physical assist needed. Ambulated on RA with desat to 87%. Recovered to 91% after 2-minute rest in recliner on  RA.   Stairs             Wheelchair Mobility    Modified Rankin (Stroke Patients Only)       Balance Overall balance assessment: Needs assistance Sitting-balance support: Feet supported;No upper extremity supported Sitting balance-Leahy Scale: Fair     Standing balance support: Bilateral upper extremity supported;During functional activity;No upper extremity supported Standing balance-Leahy Scale: Fair                              Cognition Arousal/Alertness: Awake/alert Behavior During Therapy: WFL for tasks assessed/performed Overall Cognitive Status: Within Functional Limits for tasks assessed                                        Exercises      General Comments General comments (skin integrity, edema, etc.): SpO2 97% in bed on RA on arrival. Desat to 87% during ambulation on RA. 2/4 DOE noted. Improved to 91% on RA after 2-minute recovery in recliner.      Pertinent Vitals/Pain Pain Assessment: Faces Faces Pain Scale: Hurts even more Pain Location: right side of back and rib cage Pain Descriptors / Indicators: Grimacing;Guarding;Discomfort Pain Intervention(s): Monitored during session;Repositioned    Home Living                      Prior Function            PT Goals (current goals can now be  found in the care plan section) Acute Rehab PT Goals Patient Stated Goal: home Progress towards PT goals: Progressing toward goals    Frequency    Min 5X/week      PT Plan Current plan remains appropriate    Co-evaluation              AM-PAC PT "6 Clicks" Mobility   Outcome Measure  Help needed turning from your back to your side while in a flat bed without using bedrails?: A Little Help needed moving from lying on your back to sitting on the side of a flat bed without using bedrails?: A Little Help needed moving to and from a bed to a chair (including a wheelchair)?: A Little Help needed standing up  from a chair using your arms (e.g., wheelchair or bedside chair)?: A Little Help needed to walk in hospital room?: A Little Help needed climbing 3-5 steps with a railing? : A Little 6 Click Score: 18    End of Session Equipment Utilized During Treatment: Gait belt Activity Tolerance: Patient tolerated treatment well Patient left: in chair;with call bell/phone within reach;with chair alarm set Nurse Communication: Mobility status PT Visit Diagnosis: Pain;Difficulty in walking, not elsewhere classified (R26.2)     Time: 1443-1540 PT Time Calculation (min) (ACUTE ONLY): 21 min  Charges:  $Gait Training: 8-22 mins                     Lorrin Goodell, PT  Office # [phone] Pager [phone]    Lorriane Shire 08/19/2019, 12:04 PM

## 2019-08-19 NOTE — Progress Notes (Signed)
Secure text message sent via Amion to Trauma PA- in regards to reconciliation of discharge orders.  The discharge nurse is unable to print AVS discharge instructions.

## 2019-08-28 NOTE — Discharge Summary (Signed)
    Patient ID: Caleb Blake [id number] [birth year] 54 y.o.  Admit date: 08/15/2019 Discharge date: 08/28/2019  Admitting Diagnosis: fall  Discharge Diagnosis Patient Active Problem List   Diagnosis Date Noted  . Trauma 08/15/2019    Consultants none  Reason for Admission: Fall from deck  Procedures none  Hospital Course:  66M s/p fall 7 feet from deck with multiple right rib fx with small R hydroPNX stable on repeat CXR, right 3-5 lumbar TPfxs. Participated with PT/OT, pain was controlled, he was voiding and ambulatory. He was felt to be stable for discharge.   Physical Exam: Gen: comfortable, no distress Neuro: non-focal exam HEENT: PERRL Neck: supple CV: RRR Pulm: unlabored breathing Abd: soft, NT GU: clear yellow urine Extr: wwp, no edema  Allergies as of 08/19/2019      Reactions   Penicillins    Unknown reaction      Medication List    TAKE these medications   aspirin EC 81 MG tablet Take 81 mg by mouth daily.   oxyCODONE 5 MG immediate release tablet Commonly known as: Oxy IR/ROXICODONE Take 1 tablet (5 mg total) by mouth every 6 (six) hours as needed for moderate pain, severe pain or breakthrough pain.        Follow-up Information    [facility]. Call.   Why: as needed Contact information: [address] [place] [address] [phone]       Primary care physician Follow up.   Why: Contact your insurance to establish a primary care physician in your network. Recommend seeing a provider in 2-3 weeks after discharge           Signed: Diamantina Monks, MD [facility] 08/28/2019, 9:53 AM

## 2019-09-05 ENCOUNTER — Other Ambulatory Visit: Payer: Self-pay

## 2019-09-05 ENCOUNTER — Emergency Department (HOSPITAL_COMMUNITY): Payer: Medicare Other

## 2019-09-05 ENCOUNTER — Encounter (HOSPITAL_COMMUNITY): Payer: Self-pay | Admitting: Emergency Medicine

## 2019-09-05 ENCOUNTER — Observation Stay (HOSPITAL_COMMUNITY)
Admission: EM | Admit: 2019-09-05 | Discharge: 2019-09-06 | Disposition: A | Discharge status: Home / Self Care | Payer: Medicare Other | Attending: Family Medicine | Admitting: Family Medicine

## 2019-09-05 DIAGNOSIS — J942 Hemothorax: Secondary | ICD-10-CM | POA: Diagnosis not present

## 2019-09-05 DIAGNOSIS — S271XXA Traumatic hemothorax, initial encounter: Secondary | ICD-10-CM | POA: Diagnosis not present

## 2019-09-05 DIAGNOSIS — F17210 Nicotine dependence, cigarettes, uncomplicated: Secondary | ICD-10-CM | POA: Diagnosis not present

## 2019-09-05 DIAGNOSIS — S2241XA Multiple fractures of ribs, right side, initial encounter for closed fracture: Secondary | ICD-10-CM | POA: Insufficient documentation

## 2019-09-05 DIAGNOSIS — S2249XA Multiple fractures of ribs, unspecified side, initial encounter for closed fracture: Secondary | ICD-10-CM | POA: Diagnosis present

## 2019-09-05 DIAGNOSIS — J90 Pleural effusion, not elsewhere classified: Secondary | ICD-10-CM | POA: Diagnosis present

## 2019-09-05 DIAGNOSIS — Z20822 Contact with and (suspected) exposure to covid-19: Secondary | ICD-10-CM | POA: Insufficient documentation

## 2019-09-05 DIAGNOSIS — Z7982 Long term (current) use of aspirin: Secondary | ICD-10-CM | POA: Diagnosis not present

## 2019-09-05 DIAGNOSIS — Z880 Allergy status to penicillin: Secondary | ICD-10-CM | POA: Diagnosis not present

## 2019-09-05 DIAGNOSIS — Y93H9 Activity, other involving exterior property and land maintenance, building and construction: Secondary | ICD-10-CM | POA: Insufficient documentation

## 2019-09-05 DIAGNOSIS — M25511 Pain in right shoulder: Secondary | ICD-10-CM | POA: Insufficient documentation

## 2019-09-05 DIAGNOSIS — W11XXXA Fall on and from ladder, initial encounter: Secondary | ICD-10-CM | POA: Insufficient documentation

## 2019-09-05 DIAGNOSIS — Z9889 Other specified postprocedural states: Secondary | ICD-10-CM

## 2019-09-05 DIAGNOSIS — K769 Liver disease, unspecified: Secondary | ICD-10-CM | POA: Diagnosis not present

## 2019-09-05 HISTORY — DX: Multiple fractures of ribs, unspecified side, initial encounter for closed fracture: S22.49XA

## 2019-09-05 LAB — HEPATIC FUNCTION PANEL
ALT: 33 U/L (ref 0–44)
AST: 31 U/L (ref 15–41)
Albumin: 3.2 g/dL — ABNORMAL LOW (ref 3.5–5.0)
Alkaline Phosphatase: 136 U/L — ABNORMAL HIGH (ref 38–126)
Bilirubin, Direct: 0.3 mg/dL — ABNORMAL HIGH (ref 0.0–0.2)
Indirect Bilirubin: 1.1 mg/dL — ABNORMAL HIGH (ref 0.3–0.9)
Total Bilirubin: 1.4 mg/dL — ABNORMAL HIGH (ref 0.3–1.2)
Total Protein: 7.1 g/dL (ref 6.5–8.1)

## 2019-09-05 LAB — BODY FLUID CELL COUNT WITH DIFFERENTIAL
Eos, Fluid: 9 %
Lymphs, Fluid: 30 %
Monocyte-Macrophage-Serous Fluid: 25 % — ABNORMAL LOW (ref 50–90)
Neutrophil Count, Fluid: 36 % — ABNORMAL HIGH (ref 0–25)
Total Nucleated Cell Count, Fluid: 5085 cu mm — ABNORMAL HIGH (ref 0–1000)

## 2019-09-05 LAB — LACTATE DEHYDROGENASE, PLEURAL OR PERITONEAL FLUID: LD, Fluid: 236 U/L — ABNORMAL HIGH (ref 3–23)

## 2019-09-05 LAB — CBC
HCT: 40.6 % (ref 39.0–52.0)
Hemoglobin: 13.5 g/dL (ref 13.0–17.0)
MCH: 31.4 pg (ref 26.0–34.0)
MCHC: 33.3 g/dL (ref 30.0–36.0)
MCV: 94.4 fL (ref 80.0–100.0)
Platelets: 367 10*3/uL (ref 150–400)
RBC: 4.3 MIL/uL (ref 4.22–5.81)
RDW: 11.9 % (ref 11.5–15.5)
WBC: 14.7 10*3/uL — ABNORMAL HIGH (ref 4.0–10.5)
nRBC: 0 % (ref 0.0–0.2)

## 2019-09-05 LAB — BASIC METABOLIC PANEL
Anion gap: 9 (ref 5–15)
BUN: 19 mg/dL (ref 6–20)
CO2: 27 mmol/L (ref 22–32)
Calcium: 8.7 mg/dL — ABNORMAL LOW (ref 8.9–10.3)
Chloride: 97 mmol/L — ABNORMAL LOW (ref 98–111)
Creatinine, Ser: 0.9 mg/dL (ref 0.61–1.24)
GFR calc Af Amer: >60 mL/min (ref >60)
GFR calc non Af Amer: >60 mL/min (ref >60)
Glucose, Bld: 88 mg/dL (ref 70–99)
Potassium: 4.5 mmol/L (ref 3.5–5.1)
Sodium: 133 mmol/L — ABNORMAL LOW (ref 135–145)

## 2019-09-05 LAB — GRAM STAIN
Gram Stain: NONE SEEN
Special Requests: NORMAL

## 2019-09-05 LAB — SARS CORONAVIRUS 2 BY RT PCR (HOSPITAL ORDER, PERFORMED IN ~~LOC~~ HOSPITAL LAB): SARS Coronavirus 2: NEGATIVE

## 2019-09-05 LAB — SEDIMENTATION RATE: Sed Rate: 99 mm/hr — ABNORMAL HIGH (ref 0–16)

## 2019-09-05 LAB — LACTATE DEHYDROGENASE: LDH: 101 U/L (ref 98–192)

## 2019-09-05 MED ORDER — POLYETHYLENE GLYCOL 3350 17 G PO PACK: 17.0000 g | PACK | Freq: Every day | ORAL | Status: DC | PRN

## 2019-09-05 MED ORDER — ACETAMINOPHEN 650 MG RE SUPP: 650.0000 mg | Freq: Four times a day (QID) | RECTAL | Status: DC | PRN

## 2019-09-05 MED ORDER — ONDANSETRON HCL 4 MG/2ML IJ SOLN: 4.0000 mg | Freq: Four times a day (QID) | INTRAMUSCULAR | Status: DC | PRN

## 2019-09-05 MED ORDER — ONDANSETRON HCL 4 MG PO TABS: 4.0000 mg | ORAL_TABLET | Freq: Four times a day (QID) | ORAL | Status: DC | PRN

## 2019-09-05 MED ORDER — HYDROMORPHONE HCL 1 MG/ML IJ SOLN
1.0000 mg | INTRAMUSCULAR | Status: DC | PRN
  Administered 2019-09-05 – 2019-09-06 (×5): 1 mg via INTRAVENOUS
  Filled 2019-09-05 (×5): qty 1

## 2019-09-05 MED ORDER — HYDROMORPHONE HCL 1 MG/ML IJ SOLN
1.0000 mg | Freq: Once | INTRAMUSCULAR | Status: AC
  Administered 2019-09-05: 1 mg via INTRAMUSCULAR
  Filled 2019-09-05: qty 1

## 2019-09-05 MED ORDER — ACETAMINOPHEN 325 MG PO TABS
650.0000 mg | ORAL_TABLET | Freq: Four times a day (QID) | ORAL | Status: DC | PRN
  Filled 2019-09-05: qty 2

## 2019-09-05 MED ORDER — DICLOFENAC SODIUM 1 % EX GEL
4.0000 g | Freq: Four times a day (QID) | CUTANEOUS | Status: DC
  Administered 2019-09-05 – 2019-09-06 (×7): 4 g via TOPICAL
  Filled 2019-09-05: qty 100

## 2019-09-05 MED ORDER — PREDNISONE 20 MG PO TABS
40.0000 mg | ORAL_TABLET | Freq: Two times a day (BID) | ORAL | Status: DC
  Administered 2019-09-05 – 2019-09-06 (×3): 40 mg via ORAL
  Filled 2019-09-05 (×3): qty 2

## 2019-09-05 NOTE — ED Provider Notes (Signed)
[facility] EMERGENCY DEPARTMENT Provider Note   CSN: [id number] Arrival date & time: 09/05/19  7510     History Chief Complaint  Patient presents with  . Shoulder Pain    Caleb Blake is a 54 y.o. male.  HPI     Patient presents with concern of ongoing pain in his right rib cage. Patient had a fall about 3 weeks ago, and I actually evaluated him on arrival to this emergency department following that episode. Patient sustained 11 rib fractures, 3 transverse process fractures, required transfer to our trauma center. He notes that since discharge about 2 weeks ago he has had persistent pain in his right shoulder, diffusely, worse with motion, as well as in his right rib cage, worse with activity, inspiration. No reported no fever. Patient is not achieving relief with home narcotics.   Past Medical History:  Diagnosis Date  . Head injury   . Rib fractures     Patient Active Problem List   Diagnosis Date Noted  . Trauma 08/15/2019    Past Surgical History:  Procedure Laterality Date  . ABDOMINAL SURGERY         History reviewed. No pertinent family history.  Social History   Tobacco Use  . Smoking status: Current Every Day Smoker    Packs/day: 0.50    Types: Cigarettes  . Smokeless tobacco: Never Used  Substance Use Topics  . Alcohol use: No  . Drug use: No    Home Medications Prior to Admission medications   Medication Sig Start Date End Date Taking? Authorizing Provider  aspirin EC 81 MG tablet Take 81 mg by mouth daily.    [provider]  oxyCODONE (OXY IR/ROXICODONE) 5 MG immediate release tablet Take 1 tablet (5 mg total) by mouth every 6 (six) hours as needed for moderate pain, severe pain or breakthrough pain. 08/19/19   Donnie Mesa, MD    Allergies    Penicillins  Review of Systems   Review of Systems  Constitutional:       Per HPI, otherwise negative  HENT:       Per HPI, otherwise negative  Respiratory:       Per HPI,  otherwise negative  Cardiovascular:       Per HPI, otherwise negative  Gastrointestinal: Negative for vomiting.  Endocrine:       Negative aside from HPI  Genitourinary:       Neg aside from HPI   Musculoskeletal:       Per HPI, otherwise negative  Skin: Negative.   Neurological: Negative for syncope.    Physical Exam Updated Vital Signs BP 125/85 (BP Location: Left Arm)   Pulse 79   Temp 98.7 F (37.1 C) (Oral)   Resp 18   Ht 5\' 5"  (1.651 m)   Wt 65.7 kg   SpO2 96%   BMI 24.10 kg/m   Physical Exam Vitals and nursing note reviewed.  Constitutional:      General: He is not in acute distress.    Appearance: He is well-developed.  HENT:     Head: Normocephalic and atraumatic.  Eyes:     Conjunctiva/sclera: Conjunctivae normal.  Cardiovascular:     Rate and Rhythm: Normal rate and regular rhythm.  Pulmonary:     Effort: Pulmonary effort is normal. No respiratory distress.     Breath sounds: No stridor.  Chest:    Abdominal:     General: There is no distension.  Musculoskeletal:  Arms:  Skin:    General: Skin is warm and dry.  Neurological:     Mental Status: He is alert and oriented to person, place, and time.      ED Results / Procedures / Treatments   Labs (all labs ordered are listed, but only abnormal results are displayed) Labs Reviewed  CBC - Abnormal; Notable for the following components:      Result Value   WBC 14.7 (*)    All other components within normal limits  BASIC METABOLIC PANEL - Abnormal; Notable for the following components:   Sodium 133 (*)    Chloride 97 (*)    Calcium 8.7 (*)    All other components within normal limits  SEDIMENTATION RATE - Abnormal; Notable for the following components:   Sed Rate 99 (*)    All other components within normal limits  SARS CORONAVIRUS 2 BY RT PCR (HOSPITAL ORDER, PERFORMED IN [facility])  BODY FLUID CELL COUNT WITH DIFFERENTIAL  LACTATE DEHYDROGENASE, PLEURAL OR PERITONEAL  FLUID  PATHOLOGIST SMEAR REVIEW    EKG None  Radiology DG Chest 1 View  Result Date: 09/05/2019 CLINICAL DATA:  RIGHT pleural effusion/hemothorax post thoracentesis EXAM: CHEST  1 VIEW COMPARISON:  Earlier study 09/05/2019 FINDINGS: Expiratory technique. Persistent pleural effusion and atelectasis at RIGHT lung base. No pneumothorax post thoracentesis. Stable heart size and mediastinal contours. Fractures identified at the posterolateral RIGHT third fourth fifth sixth seventh eighth and ninth ribs. IMPRESSION: No pneumothorax following RIGHT thoracentesis. Residual RIGHT pleural effusion and basilar atelectasis. Fractures of RIGHT third through ninth ribs. Electronically Signed   By: Ulyses Southward M.D.   On: 09/05/2019 15:05   DG Chest 2 View  Result Date: 09/05/2019 CLINICAL DATA:  Chest and right shoulder pain. EXAM: CHEST - 2 VIEW COMPARISON:  Chest x-ray 08/16/2019 FINDINGS: The heart is within normal limits in size. The mediastinal and hilar contours are within normal limits. Moderate to large right pleural effusion is noted without definite pneumothorax. There are posterolateral right rib fractures 3 through 9. The left lung is clear. No pleural effusion. IMPRESSION: 1. Moderate to large right pleural effusion without definite pneumothorax. 2. Right rib fractures 3 through 9. Electronically Signed   By: Rudie Meyer M.D.   On: 09/05/2019 10:24   DG Shoulder Right  Result Date: 09/05/2019 CLINICAL DATA:  History of trauma with rib fractures. Persistent right shoulder pain. EXAM: RIGHT SHOULDER - 2+ VIEW COMPARISON:  Chest CT 08/15/2019 FINDINGS: The glenohumeral and AC joints are intact. No acute shoulder fracture is identified. Posterior fifth, sixth, seventh and eighth rib fractures are noted. There is a moderate-sized right pleural effusion. No definite pneumothorax. IMPRESSION: 1. No right shoulder fracture or dislocation. 2. Posterior right fifth, sixth, seventh and eighth rib fractures.  3. Moderate-sized right pleural effusion. Electronically Signed   By: Rudie Meyer M.D.   On: 09/05/2019 10:21   CT Chest Wo Contrast  Result Date: 09/05/2019 CLINICAL DATA:  Chest pain, pleural effusion EXAM: CT CHEST WITHOUT CONTRAST TECHNIQUE: Multidetector CT imaging of the chest was performed following the standard protocol without IV contrast. COMPARISON:  08/15/2019 FINDINGS: Cardiovascular: Normal heart size.  No pericardial effusion. Mediastinum/Nodes: Leftward shift of the mediastinum. No enlarged lymph nodes identified on this noncontrast study. Visualized thyroid is unremarkable. Lungs/Pleura: Moderate to large right pleural effusion with right lower lobe, partial right middle lobe, and posterior right upper lobe atelectasis. Minimal patchy atelectasis in the left lung. No pneumothorax. Upper Abdomen: Cholelithiasis.  Musculoskeletal: Multiple right rib fractures again identified with persistent displacement of particularly fifth through eighth ribs. Increased displacement of the seventh rib. IMPRESSION: Moderate to large right pleural effusion with partial collapse of the right lung and leftward shift of the mediastinum. No pneumothorax. As seen previously, there are multiple right rib fractures, some of which are displaced. There is increased displacement of the seventh rib fracture. Electronically Signed   By: Guadlupe Spanish M.D.   On: 09/05/2019 11:58   US THORACENTESIS ASP PLEURAL SPACE W/IMG GUIDE  Result Date: 09/05/2019 INDICATION: RIGHT pleural effusion, presumed hemothorax, recently fell from a ladder on 08/15/2019 fracturing multiple RIGHT ribs EXAM: ULTRASOUND GUIDED DIAGNOSTIC AND THERAPEUTIC RIGHT THORACENTESIS MEDICATIONS: None. COMPLICATIONS: None immediate. PROCEDURE: An ultrasound guided thoracentesis was thoroughly discussed with the patient and questions answered. The benefits, risks, alternatives and complications were also discussed. The patient understands and wishes to  proceed with the procedure. Written consent was obtained. Ultrasound was performed to localize and mark an adequate pocket of fluid in the RIGHT chest. The area was then prepped and draped in the normal sterile fashion. 1% Lidocaine was used for local anesthesia. Under ultrasound guidance a 8 French thoracentesis catheter was introduced. Thoracentesis was performed. The catheter was removed and a dressing applied. FINDINGS: A total of approximately 1.2 L of old bloody fluid was removed. Samples were sent to the laboratory as requested by the clinical team. Incidentally noted cholelithiasis. In addition, several hyperechoic lesions are seen within the visualized liver, largest 2.3 x 1.4 cm, of uncertain etiology, potentially hemangiomata but requiring further evaluation. IMPRESSION: Successful ultrasound guided RIGHT thoracentesis yielding 1.2 L of old bloody RIGHT pleural fluid. Cholelithiasis. Echogenic foci within liver up to 2.3 x 1.4 cm, question hemangiomata though other hepatic lesions are not excluded; follow-up non emergent characterization by MR imaging with and without contrast recommended as an outpatient once patient is no longer short of breath and is able to better breath hold for imaging. Discussed with Dr. Jeraldine Loots on 09/05/2019 at 1500 hours. Electronically Signed   By: Ulyses Southward M.D.   On: 09/05/2019 15:04    Procedures Procedures (including critical care time)  CRITICAL CARE Performed by: Gerhard Munch Total critical care time: 35 minutes Critical care time was exclusive of separately billable procedures and treating other patients. Critical care was necessary to treat or prevent imminent or life-threatening deterioration. Critical care was time spent personally by me on the following activities: development of treatment plan with patient and/or surrogate as well as nursing, discussions with consultants, evaluation of patient's response to treatment, examination of patient,  obtaining history from patient or surrogate, ordering and performing treatments and interventions, ordering and review of laboratory studies, ordering and review of radiographic studies, pulse oximetry and re-evaluation of patient's condition.   Medications Ordered in ED Medications  diclofenac Sodium (VOLTAREN) 1 % topical gel 4 g (4 g Topical Given 09/05/19 1321)  HYDROmorphone (DILAUDID) injection 1 mg (1 mg Intramuscular Given 09/05/19 1015)  HYDROmorphone (DILAUDID) injection 1 mg (1 mg Intramuscular Given 09/05/19 1247)     ED Course  I have reviewed the triage vital signs and the nursing notes.  Pertinent labs & imaging results that were available during my care of the patient were reviewed by me and considered in my medical decision making (see chart for details).   After the initial evaluation with consideration of pleural effusion versus other pathology such as pneumonia, x-ray was performed.  Patient also had x-ray of the shoulder to  evaluate possible fracture.   Update:, Patient continues to have pain in spite of initial IM narcotics.  X-ray reviewed, notable for worsening pleural effusion, CT scan ordered.  Update:, CT demonstrates substantial pleural effusion, and with consideration of the patient's shortness of breath, chest pain, discuss case with our radiology team, patient will have thoracentesis today. Update:, I discussed patient's case with our radiologist, and with her pulmonologist who is seen and evaluated the patient in the emergency department.  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  MDM Rules/Calculators/A&P MDM Number of Diagnoses or Management Options Acute pain of right shoulder: new, no workup Pleural effusion: new, needed workup   Amount and/or Complexity of Data  Reviewed Clinical lab tests: reviewed Tests in the radiology section of CPT: reviewed Tests in the medicine section of CPT: reviewed Discussion of test results with the performing providers: yes Decide to obtain previous medical records or to obtain history from someone other than the patient: yes Obtain history from someone other than the patient: yes Review and summarize past medical records: yes Discuss the patient with other providers: yes Independent visualization of images, tracings, or specimens: yes  Risk of Complications, Morbidity, and/or Mortality Presenting problems: high Diagnostic procedures: high Management options: high   Final Clinical Impression(s) / ED Diagnoses Final diagnoses:  Pleural effusion  Acute pain of right shoulder     Gerhard Munch, MD 09/05/19 1538

## 2019-09-05 NOTE — H&P (Addendum)
History and Physical    Caleb Blake VZD:[id number] DOB: 17-Apr-1966 DOA: 09/05/2019  PCP: Patient, No Pcp Per   Patient coming from: Home  I have personally briefly reviewed patient's old medical records in [place]  Chief Complaint: Shoulder, rib pain and difficulty breathing.  HPI: Caleb Blake is a 54 y.o. male with no known  significant past medical history.  Patient presented to the ED with complaints of persistent right rib pain, right shoulder pain and onset of difficulty breathing since discharge from the hospital 5/2.  Reports some mild chronic cough.  At the time of my evaluation he s/p thoracentesis, and his breathing is much better.  Recent hospitalization 4/29 - 5/2 transferred from [facility] to [facility], under care of the trauma service, after falling about 7 feet off the side of a deck when he was reaching and trying to paint.  He sustained fracture to nearly all right ribs, many of which were displaced, also nondisplaced fractures of the right third through fifth transverse processes, and a small right hydropneumothorax.  Was admitted for observation and discharged home, did not require any procedures, and hydropneumothorax was stable on repeat chest x-ray.  ED Course: O2 sats greater than 93% on room air.  Temperature 98.  Mild leukocytosis 14.7.  Chest CT without contrast-shows moderate to large right pleural effusion with partial collapse of the right lung, leftward shift of the mediastinum.  No pneumothorax.  Again visualized -multiple rib fractures, nondisplaced. Patient had ultrasound-guided thoracentesis in the ED, with the right old bloody pleural fluid drained. EDP consulted pulmonologist Dr. Melvyn Novas, for observation, and to ensure no reaccumulation was recommended.  Review of Systems: As per HPI all other systems reviewed and negative.  Past Medical History:  Diagnosis Date  . Head injury   . Rib fractures     Past Surgical History:  Procedure  Laterality Date  . ABDOMINAL SURGERY       reports that he has been smoking cigarettes. He has been smoking about 0.50 packs per day. He has never used smokeless tobacco. He reports that he does not drink alcohol or use drugs.  Allergies  Allergen Reactions  . Penicillins     Unknown reaction     Family History  Problem Relation Age of Onset  . Diabetes Father     Prior to Admission medications   Medication Sig Start Date End Date Taking? Authorizing Provider  aspirin EC 81 MG tablet Take 81 mg by mouth daily.   Yes [provider]  oxyCODONE (OXY IR/ROXICODONE) 5 MG immediate release tablet Take 1 tablet (5 mg total) by mouth every 6 (six) hours as needed for moderate pain, severe pain or breakthrough pain. Patient not taking: Reported on 09/05/2019 08/19/19   Donnie Mesa, MD    Physical Exam: Vitals:   09/05/19 1330 09/05/19 1423 09/05/19 1435 09/05/19 1500  BP: 129/83 119/83 125/86 (!) 127/93  Pulse: 69 70 70 73  Resp: 16 18 18 16   Temp:      TempSrc:      SpO2: 93% 94% 96% 96%  Weight:      Height:        Constitutional: NAD, calm, comfortable Vitals:   09/05/19 1330 09/05/19 1423 09/05/19 1435 09/05/19 1500  BP: 129/83 119/83 125/86 (!) 127/93  Pulse: 69 70 70 73  Resp: 16 18 18 16   Temp:      TempSrc:      SpO2: 93% 94% 96% 96%  Weight:      Height:       Eyes: PERRL, lids and conjunctivae normal ENMT: Mucous membranes are moist.   Neck: normal, supple, no masses, no thyromegaly Respiratory: Anterior auscultation only, slightly reduced breath sounds on the right,  Normal respiratory effort. No accessory muscle use.  Cardiovascular: Regular rate and rhythm, no murmurs / rubs / gallops. No extremity edema. 2+ pedal pulses.  Abdomen: no tenderness, no masses palpated. No hepatosplenomegaly. Bowel sounds positive.  Musculoskeletal: no clubbing / cyanosis. No joint deformity upper and lower extremities. Good ROM, no contractures. Normal muscle tone.    Skin: no rashes, lesions, ulcers. No induration Neurologic: No apparent cranial nerve abnormality, moving all extremities spontaneously. Psychiatric: Normal judgment and insight. Alert and oriented x 3. Normal mood.   Labs on Admission: I have personally reviewed following labs and imaging studies  CBC: Recent Labs  Lab 09/05/19 1345  WBC 14.7*  HGB 13.5  HCT 40.6  MCV 94.4  PLT 332   Basic Metabolic Panel: Recent Labs  Lab 09/05/19 1345  NA 133*  K 4.5  CL 97*  CO2 27  GLUCOSE 88  BUN 19  CREATININE 0.90  CALCIUM 8.7*    Radiological Exams on Admission: DG Chest 1 View  Result Date: 09/05/2019 CLINICAL DATA:  RIGHT pleural effusion/hemothorax post thoracentesis EXAM: CHEST  1 VIEW COMPARISON:  Earlier study 09/05/2019 FINDINGS: Expiratory technique. Persistent pleural effusion and atelectasis at RIGHT lung base. No pneumothorax post thoracentesis. Stable heart size and mediastinal contours. Fractures identified at the posterolateral RIGHT third fourth fifth sixth seventh eighth and ninth ribs. IMPRESSION: No pneumothorax following RIGHT thoracentesis. Residual RIGHT pleural effusion and basilar atelectasis. Fractures of RIGHT third through ninth ribs. Electronically Signed   By: Lavonia Dana M.D.   On: 09/05/2019 15:05   DG Chest 2 View  Result Date: 09/05/2019 CLINICAL DATA:  Chest and right shoulder pain. EXAM: CHEST - 2 VIEW COMPARISON:  Chest x-ray 08/16/2019 FINDINGS: The heart is within normal limits in size. The mediastinal and hilar contours are within normal limits. Moderate to large right pleural effusion is noted without definite pneumothorax. There are posterolateral right rib fractures 3 through 9. The left lung is clear. No pleural effusion. IMPRESSION: 1. Moderate to large right pleural effusion without definite pneumothorax. 2. Right rib fractures 3 through 9. Electronically Signed   By: Marijo Sanes M.D.   On: 09/05/2019 10:24   DG Shoulder Right  Result  Date: 09/05/2019 CLINICAL DATA:  History of trauma with rib fractures. Persistent right shoulder pain. EXAM: RIGHT SHOULDER - 2+ VIEW COMPARISON:  Chest CT 08/15/2019 FINDINGS: The glenohumeral and AC joints are intact. No acute shoulder fracture is identified. Posterior fifth, sixth, seventh and eighth rib fractures are noted. There is a moderate-sized right pleural effusion. No definite pneumothorax. IMPRESSION: 1. No right shoulder fracture or dislocation. 2. Posterior right fifth, sixth, seventh and eighth rib fractures. 3. Moderate-sized right pleural effusion. Electronically Signed   By: Marijo Sanes M.D.   On: 09/05/2019 10:21   CT Chest Wo Contrast  Result Date: 09/05/2019 CLINICAL DATA:  Chest pain, pleural effusion EXAM: CT CHEST WITHOUT CONTRAST TECHNIQUE: Multidetector CT imaging of the chest was performed following the standard protocol without IV contrast. COMPARISON:  08/15/2019 FINDINGS: Cardiovascular: Normal heart size.  No pericardial effusion. Mediastinum/Nodes: Leftward shift of the mediastinum. No enlarged lymph nodes identified on this noncontrast study. Visualized thyroid is unremarkable. Lungs/Pleura: Moderate to large right pleural effusion with right lower  lobe, partial right middle lobe, and posterior right upper lobe atelectasis. Minimal patchy atelectasis in the left lung. No pneumothorax. Upper Abdomen: Cholelithiasis. Musculoskeletal: Multiple right rib fractures again identified with persistent displacement of particularly fifth through eighth ribs. Increased displacement of the seventh rib. IMPRESSION: Moderate to large right pleural effusion with partial collapse of the right lung and leftward shift of the mediastinum. No pneumothorax. As seen previously, there are multiple right rib fractures, some of which are displaced. There is increased displacement of the seventh rib fracture. Electronically Signed   By: Macy Mis M.D.   On: 09/05/2019 11:58   US THORACENTESIS ASP  PLEURAL SPACE W/IMG GUIDE  Result Date: 09/05/2019 INDICATION: RIGHT pleural effusion, presumed hemothorax, recently fell from a ladder on 08/15/2019 fracturing multiple RIGHT ribs EXAM: ULTRASOUND GUIDED DIAGNOSTIC AND THERAPEUTIC RIGHT THORACENTESIS MEDICATIONS: None. COMPLICATIONS: None immediate. PROCEDURE: An ultrasound guided thoracentesis was thoroughly discussed with the patient and questions answered. The benefits, risks, alternatives and complications were also discussed. The patient understands and wishes to proceed with the procedure. Written consent was obtained. Ultrasound was performed to localize and mark an adequate pocket of fluid in the RIGHT chest. The area was then prepped and draped in the normal sterile fashion. 1% Lidocaine was used for local anesthesia. Under ultrasound guidance a 8 French thoracentesis catheter was introduced. Thoracentesis was performed. The catheter was removed and a dressing applied. FINDINGS: A total of approximately 1.2 L of old bloody fluid was removed. Samples were sent to the laboratory as requested by the clinical team. Incidentally noted cholelithiasis. In addition, several hyperechoic lesions are seen within the visualized liver, largest 2.3 x 1.4 cm, of uncertain etiology, potentially hemangiomata but requiring further evaluation. IMPRESSION: Successful ultrasound guided RIGHT thoracentesis yielding 1.2 L of old bloody RIGHT pleural fluid. Cholelithiasis. Echogenic foci within liver up to 2.3 x 1.4 cm, question hemangiomata though other hepatic lesions are not excluded; follow-up non emergent characterization by MR imaging with and without contrast recommended as an outpatient once patient is no longer short of breath and is able to better breath hold for imaging. Discussed with Dr. Vanita Panda on 09/05/2019 at 1500 hours. Electronically Signed   By: Lavonia Dana M.D.   On: 09/05/2019 15:04    EKG: None.   Assessment/Plan Principal Problem:   Hemothorax,  right Active Problems:   Multiple rib fractures   Liver lesion   Right hemothorax-sustained from trauma and multiple rib fractures.  Ultrasound guided aspiration of the pleural space done today by IR yielded 1.2 L of dark old bloody colored fluid. -Repeat chest x-ray in a.m. -CBC a.m. -Evaluated by pulmonologist, Dr. 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 -Hold home prophylactic aspirin. - pleural fluid analysis ordered-LDH elevated at 236, body fluid cell count also ordered- as expected high numbers of nucleated cells- 5,085, talked to lab tech-specimen sent to [facility] for further analysis. -Add on serum LDH -Will add on Gram stain -may be difficult to analyze due to bloody specimen. -Will add on pleural fluid cultures.  Fall, multiple rib fractures, shoulder pain-sustained from mechanical fall. -IV Dilaudid 1 mg as needed for pain  Incidental liver lesion- Echogenic foci within liver up to 2.3 x 1.4 cm, question hemangiomata though other hepatic lesions are not excluded; follow-up non emergent characterization by MR imaging with and without contrast recommended as an outpatient once patient  is no longer short of breath and is able to better breath hold for imaging. -Problem list updated, he will need to establish care with a primary care provider, he has none. -Acute hepatitis panel -Obtain liver panel-- - Mildly elevated ALP, 136.   Leukocytosis-14.7, ESR also elevated at 99. Afebrile.  -CBC a.m. -Follow-up pleural fluid cultures.   DVT prophylaxis: SCDs Code Status: Full code Family Communication:  Disposition Plan: ~ 1 day, pending stable respiratory status, and repeat chest x-ray findings Consults called: Pulmonology. Admission status: Observation, telemetry   Bethena Roys MD [facility]  09/05/2019, 5:00 PM

## 2019-09-05 NOTE — Procedures (Signed)
PreOperative Dx: Recent fall from ladder, multiple RIGHT rib fxs, probable RIGHT hemothorax Postoperative Dx: Recent fall from ladder, multiple RIGHT rib fxs, probable RIGHT hemothorax Procedure:   US guided RIGHT thoracentesis Radiologist:  Tyron Russell Anesthesia:  10 ml of 1% lidocaine Specimen:  1.2 L of dark old bloody colored fluid EBL:   < 1 ml Complications: None

## 2019-09-05 NOTE — Consult Note (Signed)
@LOGO @  Levell July, male    DOB: 11-Apr-1966, 54 y.o.   MRN: [id number]   Brief patient profile:  30 yowm active smoker s/p multiple rib fx p fell off deck 4/28  >> admit to Trauma service at [facility]:     Admit date: 08/15/2019 Discharge date: 08/28/2019  Admitting Diagnosis: fall  Discharge Diagnosis     Patient Active Problem List   Diagnosis Date Noted  . Trauma 08/15/2019    Consultants none  Reason for Admission: Fall from deck  Procedures none  Hospital Course:  19M s/p fall 7 feet from deck with multiple right rib fx with small R hydroPNX stable on repeat CXR, right 3-5 lumbar TPfxs. Participated with PT/OT, pain was controlled, he was voiding and ambulatory. He was felt to be stable for discharge   History of Present Illness  09/05/2019  Pulmonary/ 1st  eval/Meisha Salone / Marcelina Morel / req by EDP Chief Complaint  Patient presents with  . Shoulder Pain  Dyspnea: progressed to sob at rest, worse if layed back in bed < 30  Degrees assoc with gen R cp worse with deep breath but much more comfortable p R Tcentesis w/in 30 min of exam  Cough: minimal rattle, not productive  SABA use: none  Past Medical History:  Diagnosis Date  . Head injury   . Rib fractures    No obvious day to day or daytime variability or assoc excess/ purulent sputum or mucus plugs or hemoptysis or   subjective wheeze or overt sinus or hb symptoms.     Also denies any obvious fluctuation of symptoms with weather or environmental changes or other aggravating or alleviating factors except as outlined above   No unusual exposure hx or h/o childhood pna/ asthma or knowledge of premature birth.  Current Allergies, Complete Past Medical History, Past Surgical History, Family History, and Social History were reviewed in [employer] record.  ROS  The following are not active complaints unless bolded Hoarseness, sore throat, dysphagia, dental problems, itching, sneezing,  nasal  congestion or discharge of excess mucus or purulent secretions, ear ache,   fever, chills, sweats, unintended wt loss or wt gain, classically   exertional cp,  orthopnea pnd or arm/hand swelling  or leg swelling, presyncope, palpitations, abdominal pain, anorexia, nausea, vomiting, diarrhea  or change in bowel habits or change in bladder habits, change in stools or change in urine, dysuria, hematuria,  rash, arthralgias, visual complaints, headache, numbness, weakness or ataxia or problems with walking or coordination,  change in mood or  memory.             Objective:     BP 122/85 (BP Location: Right Arm)   Pulse 82   Temp 98.9 F (37.2 C) (Oral)   Resp 19   Ht 5' 5"  (1.651 m)   Wt 65.7 kg   SpO2 97%   BMI 24.10 kg/m   SpO2: 97 %   Thin somber wm no 02 lying back at 30 degrees on stretcher    HEENT : pt wearing mask not removed for exam due to covid -19 concerns.    NECK :  without JVD/Nodes/TM/ nl carotid upstrokes bilaterally   LUNGS: no acc muscle use,  Nl contour chest with decreased bs / dullness R bottom quarter ( p just tapped off 1.2 liters old blood  without cough on insp or exp maneuvers   CV:  RRR  no s3 or murmur or increase in P2, and no  edema   ABD:  soft and nontender with nl inspiratory excursion in the supine position. No bruits or organomegaly appreciated, bowel sounds nl  MS:  Nl gait/ ext warm without deformities, calf tenderness, cyanosis or clubbing No obvious joint restrictions   SKIN: warm and dry without lesions    NEURO:  alert, approp, nl sensorium with  no motor or cerebellar deficits apparent.    CXR PA and Lateral:   09/05/2019 :    I personally reviewed images and agree with radiology impression as follows:    Moderate Left effusion  p tap: No pneumothorax following RIGHT thoracentesis. Residual RIGHT pleural effusion and basilar atelectasis. Fractures of RIGHT third through ninth ribs.    Lab Results  Component Value Date   HGB  13.5 09/05/2019   HGB 12.8 (L) 08/18/2019   HGB 14.0 08/17/2019       Lab Results  Component Value Date   ESRSEDRATE 99 (H) 09/05/2019   .      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  Cigarette smoker Counseled re importance of smoking cessation but did not meet time criteria for separate billing       Christinia Gully, MD 09/05/2019

## 2019-09-05 NOTE — Assessment & Plan Note (Signed)
Counseled re importance of smoking cessation but did not meet time criteria for separate billing   °

## 2019-09-05 NOTE — ED Notes (Signed)
Transported to ultrasound

## 2019-09-05 NOTE — Progress Notes (Signed)
Thoracentesis complete no signs of distress.  

## 2019-09-05 NOTE — Assessment & Plan Note (Signed)
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

## 2019-09-05 NOTE — ED Notes (Signed)
Transported to CT 

## 2019-09-05 NOTE — ED Triage Notes (Addendum)
PT states he was admitted on 08/15/19-08/19/19 for multiple right sided rib fractures and vertebrae fractures. PT states he has no relief from pain since his discharge and has been using his incentive spirometer but is still SOB. PT states he doesn't have a PCP to follow up with since he was discharged. PT c/o pain to right shoulder and unsure if it was examined during his previous visit.

## 2019-09-06 ENCOUNTER — Observation Stay (HOSPITAL_COMMUNITY): Payer: Medicare Other

## 2019-09-06 DIAGNOSIS — J942 Hemothorax: Secondary | ICD-10-CM | POA: Diagnosis not present

## 2019-09-06 LAB — CBC
HCT: 39 % (ref 39.0–52.0)
Hemoglobin: 13.1 g/dL (ref 13.0–17.0)
MCH: 31.1 pg (ref 26.0–34.0)
MCHC: 33.6 g/dL (ref 30.0–36.0)
MCV: 92.6 fL (ref 80.0–100.0)
Platelets: 373 10*3/uL (ref 150–400)
RBC: 4.21 MIL/uL — ABNORMAL LOW (ref 4.22–5.81)
RDW: 11.5 % (ref 11.5–15.5)
WBC: 15.5 10*3/uL — ABNORMAL HIGH (ref 4.0–10.5)
nRBC: 0 % (ref 0.0–0.2)

## 2019-09-06 LAB — PATHOLOGIST SMEAR REVIEW

## 2019-09-06 LAB — HIV ANTIBODY (ROUTINE TESTING W REFLEX): HIV Screen 4th Generation wRfx: NONREACTIVE

## 2019-09-06 MED ORDER — OXYCODONE HCL 5 MG PO TABS: 5.0000 mg | ORAL_TABLET | Freq: Four times a day (QID) | ORAL | 0 refills | Status: DC | PRN

## 2019-09-06 MED ORDER — ACETAMINOPHEN 325 MG PO TABS: 650.0000 mg | ORAL_TABLET | Freq: Four times a day (QID) | ORAL | 0 refills | Status: DC | PRN

## 2019-09-06 MED ORDER — OMEPRAZOLE 20 MG PO CPDR: 20.0000 mg | DELAYED_RELEASE_CAPSULE | Freq: Every day | ORAL | 1 refills | Status: DC

## 2019-09-06 MED ORDER — ASPIRIN EC 81 MG PO TBEC: 81.0000 mg | DELAYED_RELEASE_TABLET | Freq: Every day | ORAL | 3 refills | Status: DC

## 2019-09-06 MED ORDER — OXYCODONE HCL 5 MG PO TABS
5.0000 mg | ORAL_TABLET | Freq: Once | ORAL | Status: AC
  Administered 2019-09-06: 5 mg via ORAL
  Filled 2019-09-06: qty 1

## 2019-09-06 MED ORDER — PREDNISONE 20 MG PO TABS
50.0000 mg | ORAL_TABLET | Freq: Once | ORAL | Status: AC
  Administered 2019-09-06: 10 mg via ORAL
  Filled 2019-09-06: qty 1

## 2019-09-06 MED ORDER — PREDNISONE 20 MG PO TABS: 20.0000 mg | ORAL_TABLET | ORAL | 0 refills | Status: DC

## 2019-09-06 NOTE — Progress Notes (Signed)
@  LOGO@  Caleb Blake July, male    DOB: 1965/09/08, 54 y.o.   MRN: [id number]   Brief patient profile:  25 yowm active smoker s/p multiple rib fx p fell off deck 4/28  >> admit to Trauma service at [facility]:     Admit date: 08/15/2019 Discharge date: 08/28/2019  Admitting Diagnosis: fall  Discharge Diagnosis     Patient Active Problem List   Diagnosis Date Noted  . Trauma 08/15/2019    Consultants none  Reason for Admission: Fall from deck  Procedures none  Hospital Course:  35M s/p fall 7 feet from deck with multiple right rib fx with small R hydroPNX stable on repeat CXR, right 3-5 lumbar TPfxs. Participated with PT/OT, pain was controlled, he was voiding and ambulatory. He was felt to be stable for discharge   History of Present Illness  09/05/2019  Pulmonary/ 1st  eval/Cedar Roseman / Marcelina Morel / req by EDP Chief Complaint  Patient presents with  . Shoulder Pain  Dyspnea: progressed to sob at rest, worse if layed back in bed < 30  Degrees assoc with gen R cp worse with deep breath but much more comfortable p R Tcentesis w/in 30 min of exam  Cough: minimal rattle, not productive  SABA use: none Imp: traumatic hemothorax ? With PCIS suggested by ESR 99  rec Start pred 40 mg bid Keep overnight 5/19 and recheck cxr/ cbc in am ? Tap again   09/06/19 f/u rounds: Much better / min pain with deep breath   Objective:     BP 109/79 (BP Location: Right Arm)   Pulse 61   Temp 98.1 F (36.7 C) (Oral)   Resp 19   Ht '5\' 5"'$  (1.651 m)   Wt 65.7 kg   SpO2 96%   BMI 24.10 kg/m   SpO2: 96 %   Tmax 99.3  Pt alert, approp nad @ 30 degrees room air No jvd Oropharynx clear,  mucosa nl Neck supple Lungs with a decreased bs on R base with dullness RRR no s3 or or sign murmur Abd obese with nl  excursion  Extr warm with no edema or clubbing noted Neuro  Sensorium intact,  no apparent motor deficits          I personally reviewed images and agree with radiology impression as  follows:  PCXR:  09/06/19 1. Stable right-sided pleural effusion and overlying atelectasis. 2. Stable multiple right-sided posterior rib fractures.          Lab Results  Component Value Date   HGB 13.1 09/06/2019   HGB 13.5 09/05/2019   HGB 12.8 (L) 08/18/2019       Lab Results  Component Value Date   ESRSEDRATE 99 (H) 09/05/2019   .      Assessment   Hemothorax, right Multiple rib fx 24/28/21 - R thorcentesis 09/05/2019 yielded old blood x 1.2 liters and ESR 99 and no drop in hgb   >>> rec retap 5/20 per IR  as dry as possible and f/u in office in one week   NB he may have residual  clotted / organized blood that will not drain via a thoracentesis but typically this is gradually reabsorbed and not a major problem and not an indication for chest tube or VATS in most settings.   Cigarette smoker Strongly rec he stop smoking     Christinia Gully, MD 09/06/2019

## 2019-09-06 NOTE — Discharge Instructions (Signed)
1)Take prednisone 2 tablets (40mg ) twice daily for 3 days, then Prednisone 2 Tablets (40mg ) once daily for 4 days, then Take Prednisone 20mg  (1 tab) once daily for 4 days then STOP--always take with food  2)Avoid ibuprofen/Advil/Aleve/Motrin/Goody Powders/Naproxen/BC powders/Meloxicam/Diclofenac/Indomethacin and other Nonsteroidal anti-inflammatory medications as these will make you more likely to bleed and can cause stomach ulcers, can also cause Kidney problems.   3) follow-up with pulmonologist Dr. in 2 weeks in [place]  4) you are strongly advised to quit smoking

## 2019-09-06 NOTE — Procedures (Signed)
PreOperative Dx: RIGHT hemothorax Postoperative Dx: RIGHT hemothorax Procedure:   US guided RIGHT thoracentesis Radiologist:  Tyron Russell Anesthesia:  10 ml of 1% lidocaine Specimen:  700 mL of dark old bloody colored fluid EBL:   < 1 ml Complications: None

## 2019-09-06 NOTE — Discharge Summary (Signed)
Caleb Blake, is a 54 y.o. male  DOB Oct 24, 1965  MRN [id number].  Admission date:  09/05/2019  Admitting Physician  Bethena Roys, MD  Discharge Date:  09/06/2019   Primary MD  Patient, No Pcp Per  Recommendations for primary care physician for things to follow:   1)Take prednisone 2 tablets (92m) twice daily for 3 days, then Prednisone 2 Tablets (458m once daily for 4 days, then Take Prednisone 2045m1 tab) once daily for 4 days then STOP--always take with food  2)Avoid ibuprofen/Advil/Aleve/Motrin/Goody Powders/Naproxen/BC powders/Meloxicam/Diclofenac/Indomethacin and other Nonsteroidal anti-inflammatory medications as these will make you more likely to bleed and can cause stomach ulcers, can also cause Kidney problems.   3) follow-up with pulmonologist Dr. WerMelvyn Novas 2 weeks in [place]) you are strongly advised to quit smoking  Admission Diagnosis  Pleural effusion [J90] Status post thoracentesis [Z98.890] Hemothorax, right [J94.2] Acute pain of right shoulder [M25.511]   Discharge Diagnosis  Pleural effusion [J90] Status post thoracentesis [Z98.890] Hemothorax, right [J94.2] Acute pain of right shoulder [M25.511]    Principal Problem:   Hemothorax, right-----??Dressler Type inflammation Active Problems:   Liver lesion-- Needs MRI as outpatient once breathing is better   Cigarette smoker   Multiple rib fractures      Past Medical History:  Diagnosis Date  . Head injury   . Rib fractures     Past Surgical History:  Procedure Laterality Date  . ABDOMINAL SURGERY        HPI  from the history and physical done on the day of admission:   Chief Complaint: Shoulder, rib pain and difficulty breathing.  HPI: Caleb Blake a [age]. male with no known  significant past medical history.  Patient presented to the ED with complaints of persistent right rib pain, right  shoulder pain and onset of difficulty breathing since discharge from the hospital 5/2.  Reports some mild chronic cough.  At the time of my evaluation he s/p thoracentesis, and his breathing is much better.  Recent hospitalization 4/29 - [facility]/2 [facility], after falling about 7 feet off the side of a deck when he was reaching and trying to paint.  He sustained fracture to nearly all right ribs, many of which were displaced, also nondisplaced fractures of the right third through fifth transverse processes, and a small right hydropneumothorax.  Was admitted for observation and discharged home, did not require any procedures, and hydropneumothorax was stable on repeat chest x-ray.  ED Course: O2 sats greater than 93% on room air.  Temperature 98.  Mild leukocytosis 14.7.  Chest CT without contrast-shows moderate to large right pleural effusion with partial collapse of the right lung, leftward shift of the mediastinum.  No pneumothorax.  Again visualized -multiple rib fractures, nondisplaced. Patient had ultrasound-guided thoracentesis in the ED, with the right old bloody pleural fluid drained. EDP consulted pulmonologist Dr. WerMelvyn Novasor observation, and to ensure no reaccumulation was recommended.  Review  of Systems: As per HPI all other systems reviewed and negative.    Hospital Course:    1) traumatic Rt hemothorax with multiple right-sided rib fractures from ribs 5 thru Rib 10--initial trauma 08/15/2019 -Pulmonology consult appreciated -Post ultrasound-guided thoracentesis with 1.2 L of old blood removed on 09/05/2019 -Repeat ultrasound thoracentesis on 09/06/2019 with 700 mL of old blood -Pleural fluid Gram stain and cultures NGTD -Elevated ESR noted with concerns for dressler's type inflammatory condition--Dr. Dema Severin advises prednisone with slow taper -She will follow up with pulmonologist Dr. Dema Severin as outpatient --Please see full  consult note from pulmonologist dated 09/05/2019 and follow-up progress note from pulmonologist dated 09/06/2019 -Some of the right-sided fractured ribs displaced   2) tobacco abuse--- smoking cessation advised  3)Liver Lesion---Echogenic foci within liver up to 2.3 x 1.4 cm, question hemangiomata though other hepatic lesions are not excluded; follow-up non emergent characterization by MR imaging with and without contrast recommended as an outpatient once patient is no longer short of breath and is able to better breath hold for imaging. --Patient aware and he will follow up as outpatient once breathing improves  Discharge Condition: stable, no hypoxia  Follow UP--- pulmonologist  Follow-up Information    Tanda Rockers, MD Follow up in 10 day(s).   Specialty: Pulmonary Disease Why: Needs to see pulmonologist Dr. Christinia Gully in [place] in about 10 days or so Contact information: [address] [place] [address] [phone]            Consults obtained -pulmonology  Diet and Activity recommendation:  As advised  Discharge Instructions    Discharge Instructions    Call MD for:  difficulty breathing, headache or visual disturbances   Complete by: As directed    Call MD for:  extreme fatigue   Complete by: As directed    Call MD for:  persistant dizziness or light-headedness   Complete by: As directed    Call MD for:  persistant nausea and vomiting   Complete by: As directed    Call MD for:  severe uncontrolled pain   Complete by: As directed    Call MD for:  temperature >100.4   Complete by: As directed    Diet - low sodium heart healthy   Complete by: As directed    Discharge instructions   Complete by: As directed    1)Take prednisone 2 tablets (54m) twice daily for 3 days, then Prednisone 2 Tablets (412m once daily for 4 days, then Take Prednisone 2056m1 tab) once daily for 4 days then STOP--always take with food  2)Avoid  ibuprofen/Advil/Aleve/Motrin/Goody Powders/Naproxen/BC powders/Meloxicam/Diclofenac/Indomethacin and other Nonsteroidal anti-inflammatory medications as these will make you more likely to bleed and can cause stomach ulcers, can also cause Kidney problems.   3) follow-up with pulmonologist Dr. WerMelvyn Novas 2 weeks in [place]) you are strongly advised to quit smoking   Increase activity slowly   Complete by: As directed         Discharge Medications     Allergies as of 09/06/2019      Reactions   Penicillins    Unknown reaction      Medication List    TAKE these medications   acetaminophen 325 MG tablet Commonly known as: TYLENOL Take 2 tablets (650 mg total) by mouth every 6 (six) hours as needed for mild pain (or Fever >/= 101).   aspirin EC 81 MG tablet Take 1 tablet (81 mg total) by mouth daily  with breakfast. Hold aspirin until 09/12/19 Start taking on: Sep 12, 2019 What changed:   when to take this  additional instructions  These instructions start on Sep 12, 2019. If you are unsure what to do until then, ask your doctor or other care provider.   omeprazole 20 MG capsule Commonly known as: PriLOSEC Take 1 capsule (20 mg total) by mouth daily.   oxyCODONE 5 MG immediate release tablet Commonly known as: Oxy IR/ROXICODONE Take 1 tablet (5 mg total) by mouth every 6 (six) hours as needed for moderate pain, severe pain or breakthrough pain.   predniSONE 20 MG tablet Commonly known as: Deltasone Take 1 tablet (20 mg total) by mouth See admin instructions. Take prednisone 2 tablets (55m) twice daily for 3 days, then Prednisone 2 Tablets (472m once daily for 4 days, then Take Prednisone 2063m1 tab) once daily for 4 days then STOP--always take with food       Major procedures and Radiology Reports - PLEASE review detailed and final reports for all details, in brief -     DG Chest 1 View  Result Date: 09/06/2019 CLINICAL DATA:  [place] fract[place]  multiple RIGHT ribs, hemothorax, persistent effusion and atelectasis EXAM: CHEST  1 VIEW COMPARISON:  09/05/2019 and 09/06/2019 FINDINGS: Mild residual RIGHT basilar effusion and atelectasis. No pneumothorax following RIGHT thoracentesis. Heart size stable. LEFT lung clear. IMPRESSION: No pneumothorax following RIGHT thoracentesis. Persistent mild RIGHT basilar atelectasis and effusion decreased since the earlier exam. Electronically Signed   By: MarLavonia DanaD.   On: 09/06/2019 15:15   DG Chest 1 View  Result Date: 09/05/2019 CLINICAL DATA:  RIGHT pleural effusion/hemothorax post thoracentesis EXAM: CHEST  1 VIEW COMPARISON:  Earlier study 09/05/2019 FINDINGS: Expiratory technique. Persistent pleural effusion and atelectasis at RIGHT lung base. No pneumothorax post thoracentesis. Stable heart size and mediastinal contours. Fractures identified at the posterolateral RIGHT third fourth fifth sixth seventh eighth and ninth ribs. IMPRESSION: No pneumothorax following RIGHT thoracentesis. Residual RIGHT pleural effusion and basilar atelectasis. Fractures of RIGHT third through ninth ribs. Electronically Signed   By: MarLavonia DanaD.   On: 09/05/2019 15:05   DG Chest 2 View  Result Date: 09/05/2019 CLINICAL DATA:  Chest and right shoulder pain. EXAM: CHEST - 2 VIEW COMPARISON:  Chest x-ray 08/16/2019 FINDINGS: The heart is within normal limits in size. The mediastinal and hilar contours are within normal limits. Moderate to large right pleural effusion is noted without definite pneumothorax. There are posterolateral right rib fractures 3 through 9. The left lung is clear. No pleural effusion. IMPRESSION: 1. Moderate to large right pleural effusion without definite pneumothorax. 2. Right rib fractures 3 through 9. Electronically Signed   By: P. Marijo SanesD.   On: 09/05/2019 10:24   DG Shoulder Right  Result Date: 09/05/2019 CLINICAL DATA:  History of trauma with rib fractures. Persistent right shoulder  pain. EXAM: RIGHT SHOULDER - 2+ VIEW COMPARISON:  Chest CT 08/15/2019 FINDINGS: The glenohumeral and AC joints are intact. No acute shoulder fracture is identified. Posterior fifth, sixth, seventh and eighth rib fractures are noted. There is a moderate-sized right pleural effusion. No definite pneumothorax. IMPRESSION: 1. No right shoulder fracture or dislocation. 2. Posterior right fifth, sixth, seventh and eighth rib fractures. 3. Moderate-sized right pleural effusion. Electronically Signed   By: P. Marijo SanesD.   On: 09/05/2019 10:21   CT Chest Wo Contrast  Result Date: 09/05/2019 CLINICAL DATA:  Chest pain, pleural effusion EXAM:  CT CHEST WITHOUT CONTRAST TECHNIQUE: Multidetector CT imaging of the chest was performed following the standard protocol without IV contrast. COMPARISON:  08/15/2019 FINDINGS: Cardiovascular: Normal heart size.  No pericardial effusion. Mediastinum/Nodes: Leftward shift of the mediastinum. No enlarged lymph nodes identified on this noncontrast study. Visualized thyroid is unremarkable. Lungs/Pleura: Moderate to large right pleural effusion with right lower lobe, partial right middle lobe, and posterior right upper lobe atelectasis. Minimal patchy atelectasis in the left lung. No pneumothorax. Upper Abdomen: Cholelithiasis. Musculoskeletal: Multiple right rib fractures again identified with persistent displacement of particularly fifth through eighth ribs. Increased displacement of the seventh rib. IMPRESSION: Moderate to large right pleural effusion with partial collapse of the right lung and leftward shift of the mediastinum. No pneumothorax. As seen previously, there are multiple right rib fractures, some of which are displaced. There is increased displacement of the seventh rib fracture. Electronically Signed   By: Macy Mis M.D.   On: 09/05/2019 11:58   CT Chest Wo Contrast  Result Date: 08/15/2019 CLINICAL DATA:  Fall, right flank and rib pain EXAM: CT CHEST WITHOUT  CONTRAST TECHNIQUE: Multidetector CT imaging of the chest was performed following the standard protocol without IV contrast. COMPARISON:  None. FINDINGS: Cardiovascular: Normal heart size.  No pericardial effusion. Mediastinum/Nodes: No enlarged lymph nodes. Thyroid is unremarkable. Lungs/Pleura: Small right hydropneumothorax. Right lower lobe dependent atelectasis. Minimal left lower lobe dependent atelectasis. Upper Abdomen: Cholelithiasis. Musculoskeletal: Mild subcutaneous emphysema. Multiple rib fractures with partial imaging of the inferior right: Nondisplaced posterior right first rib Nondisplaced anterolateral right third and fourth ribs Variably displaced posterolateral fifth-tenth ribs. Nondisplaced posterior eleventh rib. Probable nondisplaced fractures of the right third through fifth transverse processes. IMPRESSION: Acute fractures of nearly all right ribs, which are partially imaged. There is variable displacement of the fifth-tenth ribs. Probable nondisplaced fractures of the right third-fifth transverse processes. Small right hydropneumothorax.  Bibasilar atelectasis. Electronically Signed   By: Macy Mis M.D.   On: 08/15/2019 17:12   Portable chest 1 View  Result Date: 09/06/2019 CLINICAL DATA:  Follow-up pleural effusion. EXAM: PORTABLE CHEST 1 VIEW COMPARISON:  09/05/2019 FINDINGS: Stable right-sided pleural effusion, moderate in size. Overlying atelectasis is also noted. The cardiac silhouette, mediastinal and hilar contours are stable. The left lung remains clear. Stable multiple right-sided posterior rib fractures. IMPRESSION: 1. Stable right-sided pleural effusion and overlying atelectasis. 2. Stable multiple right-sided posterior rib fractures. Electronically Signed   By: Marijo Sanes M.D.   On: 09/06/2019 05:47   DG CHEST PORT 1 VIEW  Result Date: 08/16/2019 CLINICAL DATA:  Multiple right rib fractures. Fall 10 feet from a deck. New cough. EXAM: PORTABLE CHEST 1 VIEW  COMPARISON:  Radiograph and CT yesterday. FINDINGS: Multiple right rib fractures again seen. There is elevation of right hemidiaphragm with adjacent patchy basilar opacity. Left pleural effusion, slightly worsened. No evidence of pneumothorax. Left lung is clear. Unchanged heart size and mediastinal contours. IMPRESSION: 1. Slight increased right pleural effusion and adjacent patchy opacity in the right lung base since imaging yesterday. 2. Multiple right rib fractures are again seen. No pneumothorax. Electronically Signed   By: Keith Rake M.D.   On: 08/16/2019 16:25   DG Chest Port 1 View  Result Date: 08/15/2019 CLINICAL DATA:  Fall with possible rib fracture right. Rule out pneumothorax. Fall off deck. Severe right-sided pain. EXAM: PORTABLE CHEST 1 VIEW COMPARISON:  Radiograph 06/19/2011 FINDINGS: Multiple right rib fractures including ribs 4 through 9 posteriorly. No new these fractures are displaced. There  is small amount of subcutaneous gas in the right chest wall. No visualized pneumothorax. Chronic elevation of right hemidiaphragm with adjacent scarring. The heart is normal in size with unchanged mediastinal contours. No evidence of acute left rib fracture. No obvious scapular or shoulder girdle fracture. IMPRESSION: Multiple right rib fractures including ribs 4 through 9 posteriorly, many of which are displaced. No visualized pneumothorax. Small amount of subcutaneous gas in the right chest wall. Chest CT is planned. Electronically Signed   By: Keith Rake M.D.   On: 08/15/2019 16:01   US THORACENTESIS ASP PLEURAL SPACE W/IMG GUIDE  Result Date: 09/06/2019 INDICATION: RIGHT hemothorax post fall from ladder with multiple RIGHT rib fractures EXAM: ULTRASOUND GUIDED THERAPEUTIC RIGHT THORACENTESIS MEDICATIONS: None COMPLICATIONS: None immediate PROCEDURE: An ultrasound guided thoracentesis was thoroughly discussed with the patient and questions answered. The benefits, risks, alternatives  and complications were also discussed. The patient understands and wishes to proceed with the procedure. Written consent was obtained. Ultrasound was performed to localize and mark an adequate pocket of fluid in the RIGHT chest. The area was then prepped and draped in the normal sterile fashion. 1% Lidocaine was used for local anesthesia. Under ultrasound guidance a 8 French thoracentesis catheter was introduced. Thoracentesis was performed. The catheter was removed and a dressing applied. FINDINGS: A total of approximately 700 mL of dark old bloody fluid was removed. IMPRESSION: Successful ultrasound guided RIGHT thoracentesis yielding 700 mL of dark old bloody pleural fluid. Electronically Signed   By: Lavonia Dana M.D.   On: 09/06/2019 15:41   US THORACENTESIS ASP PLEURAL SPACE W/IMG GUIDE  Result Date: 09/05/2019 INDICATION: RIGHT pleural effusion, presumed hemothorax, recently fell from a ladder on 08/15/2019 fracturing multiple RIGHT ribs EXAM: ULTRASOUND GUIDED DIAGNOSTIC AND THERAPEUTIC RIGHT THORACENTESIS MEDICATIONS: None. COMPLICATIONS: None immediate. PROCEDURE: An ultrasound guided thoracentesis was thoroughly discussed with the patient and questions answered. The benefits, risks, alternatives and complications were also discussed. The patient understands and wishes to proceed with the procedure. Written consent was obtained. Ultrasound was performed to localize and mark an adequate pocket of fluid in the RIGHT chest. The area was then prepped and draped in the normal sterile fashion. 1% Lidocaine was used for local anesthesia. Under ultrasound guidance a 8 French thoracentesis catheter was introduced. Thoracentesis was performed. The catheter was removed and a dressing applied. FINDINGS: A total of approximately 1.2 L of old bloody fluid was removed. Samples were sent to the laboratory as requested by the clinical team. Incidentally noted cholelithiasis. In addition, several hyperechoic lesions are  seen within the visualized liver, largest 2.3 x 1.4 cm, of uncertain etiology, potentially hemangiomata but requiring further evaluation. IMPRESSION: Successful ultrasound guided RIGHT thoracentesis yielding 1.2 L of old bloody RIGHT pleural fluid. Cholelithiasis. Echogenic foci within liver up to 2.3 x 1.4 cm, question hemangiomata though other hepatic lesions are not excluded; follow-up non emergent characterization by MR imaging with and without contrast recommended as an outpatient once patient is no longer short of breath and is able to better breath hold for imaging. Discussed with Dr. Vanita Panda on 09/05/2019 at 1500 hours. Electronically Signed   By: Lavonia Dana M.D.   On: 09/05/2019 15:04    Micro Results    Recent Results (from the past 240 hour(s))  SARS Coronavirus 2 by RT PCR (hospital order, performed in [facility] hospital lab) Nasopharyngeal Nasopharyngeal Swab     Status: None   Collection Time: 09/05/19  1:34 PM   Specimen: Nasopharyngeal Swab  Result  Value Ref Range Status   SARS Coronavirus 2 NEGATIVE NEGATIVE Final    Comment: (NOTE) SARS-CoV-2 target nucleic acids are NOT DETECTED. The SARS-CoV-2 RNA is generally detectable in upper and lower respiratory specimens during the acute phase of infection. The lowest concentration of SARS-CoV-2 viral copies this assay can detect is 250 copies / mL. A negative result does not preclude SARS-CoV-2 infection and should not be used as the sole basis for treatment or other patient management decisions.  A negative result may occur with improper specimen collection / handling, submission of specimen other than nasopharyngeal swab, presence of viral mutation(s) within the areas targeted by this assay, and inadequate number of viral copies (<250 copies / mL). A negative result must be combined with clinical observations, patient history, and epidemiological information. Fact Sheet for Patients:     StrictlyIdeas.no Fact Sheet for Healthcare Providers: BankingDealers.co.za This test is not yet approved or cleared  by the [place] FDA and has been authorized for detection and/or diagnosis of SARS-CoV-2 by FDA under an Emergency Use Authorization (EUA).  This EUA will remain in effect (meaning this test can be used) for the duration of the COVID-19 declaration under Section 564(b)(1) of the Act, 21 U.S.C. section 360bbb-3(b)(1), unless the authorization is terminated or revoked sooner. Performed at [facility], [address], [place], [place] [address]   Stat Gram stain     Status: None   Collection Time: 09/05/19  2:30 PM   Specimen: Pleura; Body Fluid  Result Value Ref Range Status   Specimen Description PLEURAL  Final   Special Requests Normal  Final   Gram Stain   Final    NO ORGANISMS SEEN WBC PRESENT,BOTH PMN AND MONONUCLEAR [place] Performed at [facility], [address] [place], [place] [address]    Report Status 09/05/2019 FINAL  Final  Culture, body fluid-bottle     Status: None (Preliminary result)   Collection Time: 09/05/19  2:30 PM   Specimen: Pleura  Result Value Ref Range Status   Specimen Description PLEURAL  Final   Special Requests BOTTLES DRAWN AEROBIC AND ANAEROBIC 10CC  Final   Culture   Final    NO GROWTH < 24 HOURS Performed at [facility], [address] [place], [place] [address]    Report Status PENDING  Incomplete       Today   Subjective    Caleb Blake today has no new complaints -Rib cage pain not worse No fever  Or chills   No Nausea, Vomiting or Diarrhea         Patient has been seen and examined prior to discharge   Objective   Blood pressure 124/73, pulse 74, temperature 97.6 F (36.4 C), resp. rate 18, height 5' 5"  (1.651 m), weight 65.7 kg, SpO2 97 %.   Intake/Output Summary (Last 24 hours) at 09/06/2019 1723 Last data filed at 09/06/2019 1356 Gross per 24  hour  Intake 240 ml  Output --  Net 240 ml    Exam Gen:- Awake Alert, no acute distress  HEENT:- [place].AT, No sclera icterus Neck-Supple Neck,No JVD,.  Lungs-improved air movement on the right, no wheezing  CV- S1, S2 normal, regular Abd-  +ve B.Sounds, Abd Soft, No tenderness,    Extremity/Skin:- No  edema,   good pulses Psych-affect is appropriate, oriented x3 Neuro-no new focal deficits, no tremors    Data Review   CBC w Diff:  Lab Results  Component Value Date   WBC 15.5 (H)  09/06/2019   HGB 13.1 09/06/2019   HCT 39.0 09/06/2019   PLT 373 09/06/2019   LYMPHOPCT 4 08/15/2019   MONOPCT 6 08/15/2019   EOSPCT 0 08/15/2019   BASOPCT 0 08/15/2019    CMP:  Lab Results  Component Value Date   NA 133 (L) 09/05/2019   K 4.5 09/05/2019   CL 97 (L) 09/05/2019   CO2 27 09/05/2019   BUN 19 09/05/2019   CREATININE 0.90 09/05/2019   PROT 7.1 09/05/2019   ALBUMIN 3.2 (L) 09/05/2019   BILITOT 1.4 (H) 09/05/2019   ALKPHOS 136 (H) 09/05/2019   AST 31 09/05/2019   ALT 33 09/05/2019  .   Total Discharge time is about 33 minutes  Roxan Hockey M.D on 09/06/2019 at 5:23 PM  Go to [email or web address] -  for contact info  [facility] - Office  [phone]

## 2019-09-06 NOTE — Care Management Obs Status (Signed)
MEDICARE OBSERVATION STATUS NOTIFICATION   Patient Details  Name: Caleb Blake MRN: [id number] Date of Birth: Jul 04, 1965   Medicare Observation Status Notification Given:  Yes    Corey Harold 09/06/2019, 3:29 PM

## 2019-09-10 LAB — CULTURE, BODY FLUID W GRAM STAIN -BOTTLE: Culture: NO GROWTH

## 2019-10-17 ENCOUNTER — Ambulatory Visit (HOSPITAL_COMMUNITY)
Admission: RE | Admit: 2019-10-17 | Discharge: 2019-10-17 | Disposition: A | Discharge status: Home / Self Care | Payer: Medicare Other | Source: Ambulatory Visit | Attending: Internal Medicine | Admitting: Internal Medicine

## 2019-10-17 ENCOUNTER — Encounter: Payer: Self-pay | Admitting: Internal Medicine

## 2019-10-17 ENCOUNTER — Ambulatory Visit (INDEPENDENT_AMBULATORY_CARE_PROVIDER_SITE_OTHER): Payer: Medicare Other | Admitting: Internal Medicine

## 2019-10-17 ENCOUNTER — Other Ambulatory Visit: Payer: Self-pay

## 2019-10-17 DIAGNOSIS — J942 Hemothorax: Secondary | ICD-10-CM

## 2019-10-17 DIAGNOSIS — Q791 Other congenital malformations of diaphragm: Secondary | ICD-10-CM | POA: Diagnosis not present

## 2019-10-17 DIAGNOSIS — F17210 Nicotine dependence, cigarettes, uncomplicated: Secondary | ICD-10-CM | POA: Diagnosis not present

## 2019-10-17 NOTE — Assessment & Plan Note (Addendum)
Congratulated on maintaining off cigs    I reviewed the Fletcher curve with the patient that basically indicates  if you quit smoking when your best day FEV1 is still well preserved (as is clearly  the case here)  it is highly unlikely you will progress to severe disease and informed the patient there was  no medication on the market that has proven to alter the curve/ its downward trajectory  or the likelihood of progression of their disease(unlike other chronic medical conditions such as atheroclerosis where we do think we can change the natural hx with risk reducing meds)    Therefore stopping smoking and maintaining abstinence are  the most important aspects of his care, not choice of inhalers or for that matter, doctors.   Treatment other than smoking cessation  is entirely directed by severity of symptoms and focused also on reducing exacerbations, not attempting to change the natural history of the disease.    Since not limited and having no aecopd no need for rx    >>> f/u in 3 m for baseline pfts rec/ see prn          Each maintenance medication was reviewed in detail including emphasizing most importantly the difference between maintenance and prns and under what circumstances the prns are to be triggered using an action plan format where appropriate.  Total time for H and P, chart review, counseling, teaching device and generating customized AVS unique to this post hosp f/u  office visit / charting = 30 min

## 2019-10-17 NOTE — Progress Notes (Signed)
Caleb Blake, male    DOB: 08/05/1965, 54 y.o.   MRN: [id number]   Brief patient profile:  12 yowm quit smoking 08/15/19 on admit with [facility] trauma service  R  hemothorax   Admission date:  09/05/2019   Discharge Date:  09/06/2019   Discharge Diagnosis  Pleural effusion [J90] Status post thoracentesis [Z98.890] Hemothorax, right [J94.2] Acute pain of right shoulder [M25.511]   Hemothorax, right-----??Dressler Type inflammation   Liver lesion-- Needs MRI as outpatient once breathing is better   Cigarette smoker   Multiple rib fractures          Past Medical History:  Diagnosis Date  . Head injury   . Rib fractures          Past Surgical History:  Procedure Laterality Date  . ABDOMINAL SURGERY        HPI  from the history and physical done on the day of admission:   Chief Complaint:Shoulder, rib pain and difficulty breathing.  GYB:WLSLHT G Caleb Blake a 54 y.o.malewithno knownsignificant past medical history.Patient presented to the ED with complaints of persistent right rib pain, right shoulder pain and onset of difficulty breathing since discharge from the hospital 5/2. Reports some mild chronic cough. At the time of my evaluation he s/pthoracentesis, and his breathing is much better.  Recent hospitalization4/29 - [facility], under care ofthe trauma service,after falling about 7 feet off the side of a deck when he was reaching and trying to paint. He sustained fracture tonearly all right ribs, many of which were displaced,also nondisplaced fractures of the right third through fifth transverse processes, and a small right hydropneumothorax. Was admitted for observation and discharged home, did not require any procedures, and hydropneumothorax was stable on repeat chest x-ray.  ED Course:O2 sats greater than 93% on room air. Temperature 98. Mild leukocytosis 14.7. Chest CT without contrast-shows moderate to  large right pleural effusion with partial collapse of the right lung, leftward shift of the mediastinum. No pneumothorax. Again visualized -multiple rib fractures,nondisplaced. Patient had ultrasound-guided thoracentesis in the ED, with the right old bloody pleural fluid drained.EDPconsulted pulmonologist Dr. Melida Quitter observation, andto ensure no reaccumulation was recommended.  Review of Systems: As per HPI all other systems reviewed and negative.    Hospital Course:    1) traumatic Rt hemothorax with multiple right-sided rib fractures from ribs 5 thru Rib 10--initial trauma 08/15/2019 -Pulmonology consult appreciated -Post ultrasound-guided thoracentesis with 1.2 L of old blood removed on 09/05/2019 -Repeat ultrasound thoracentesis on 09/06/2019 with 700 mL of old blood -Pleural fluid Gram stain and cultures NGTD -Elevated ESR noted with concerns for dressler's type inflammatory condition--Dr. Dema Severin advises prednisone with slow taper -She will follow up with pulmonologist Dr. Dema Severin as outpatient --Please see full consult note from pulmonologist dated 09/05/2019 and follow-up progress note from pulmonologist dated 09/06/2019 -Some of the right-sided fractured ribs displaced   2) tobacco abuse--- smoking cessation advised  3)Liver Lesion---Echogenic foci within liver up to 2.3 x 1.4 cm, question hemangiomata though other hepatic lesions are not excluded; follow-up non emergent characterization by MR imaging with and without contrast recommended as an outpatient once patient is no longer short of breath and is able to better breath hold for imaging. --Patient aware and he will follow up as outpatient once breathing improves    History of Present Illness  10/17/2019  Pulmonary/ 1st office eval/Caleb Blake / f/u post hosp/ not vaccinated yet  Chief Complaint  Patient presents with  .  Hospitalization Follow-up    Breathing has improved since hospital d/c. He states his breathing overall  does well unless he gets too hot.   Dyspnea:  Not limited by breathing from desired activities   Cough: none / better since quit smoking  Sleep: bed is flat/ 2 pillows SABA use: none  Still some positional R Chest wall discomfort  No obvious day to day or daytime variability or assoc excess/ purulent sputum or mucus plugs or hemoptysis or  chest tightness, subjective wheeze or overt sinus or hb symptoms.   Sleeping as above  without nocturnal  or early am exacerbation  of respiratory  c/o's or need for noct saba. Also denies any obvious fluctuation of symptoms with weather or environmental changes or other aggravating or alleviating factors except as outlined above   No unusual exposure hx or h/o childhood pna/ asthma or knowledge of premature birth.  Current Allergies, Complete Past Medical History, Past Surgical History, Family History, and Social History were reviewed in [employer] record.  ROS  The following are not active complaints unless bolded Hoarseness, sore throat, dysphagia, dental problems, itching, sneezing,  nasal congestion or discharge of excess mucus or purulent secretions, ear ache,   fever, chills, sweats, unintended wt loss or wt gain, classically pleuritic or exertional cp,  orthopnea pnd or arm/hand swelling  or leg swelling, presyncope, palpitations, abdominal pain, anorexia, nausea, vomiting, diarrhea  or change in bowel habits or change in bladder habits, change in stools or change in urine, dysuria, hematuria,  rash, arthralgias, visual complaints, headache, numbness, weakness or ataxia or problems with walking or coordination,  change in mood or  memory.           Past Medical History:  Diagnosis Date  . Head injury   . Rib fractures     Outpatient Medications Prior to Visit  Medication Sig Dispense Refill  . acetaminophen (TYLENOL) 325 MG tablet Take 2 tablets (650 mg total) by mouth every 6 (six) hours as needed for mild pain (or Fever  >/= 101). 12 tablet 0  . aspirin EC 81 MG tablet Take 1 tablet (81 mg total) by mouth daily with breakfast. Hold aspirin until 09/12/19 30 tablet 3  . omeprazole (PRILOSEC) 20 MG capsule Take 1 capsule (20 mg total) by mouth daily. 30 capsule 1  . oxyCODONE (OXY IR/ROXICODONE) 5 MG immediate release tablet Take 1 tablet (5 mg total) by mouth every 6 (six) hours as needed for moderate pain, severe pain or breakthrough pain. 12 tablet 0  . predniSONE (DELTASONE) 20 MG tablet finished  0      Objective:     BP 140/90 (BP Location: Left Arm, Cuff Size: Normal)   Pulse 78   Temp 97.8 F (36.6 C) (Oral)   Ht _0  (1.676 m)   Wt 148 lb (67.1 kg)   SpO2 98% Comment: on RA  BMI 23.89 kg/m   SpO2: 98 % (on RA)   Thin pleasant wm nad   HEENT : pt wearing mask not removed for exam due to covid -19 concerns.    NECK :  without JVD/Nodes/TM/ nl carotid upstrokes bilaterally   LUNGS: no acc muscle use,  Nl contour chest with minimal dullness/ decreased BS R base without cough on insp or exp maneuvers   CV:  RRR  no s3 or murmur or increase in P2, and no edema   ABD:  soft and nontender with nl inspiratory excursion in the supine position.  No bruits or organomegaly appreciated, bowel sounds nl  MS:  Nl gait/ ext warm without deformities, calf tenderness, cyanosis or clubbing No obvious joint restrictions   SKIN: warm and dry without lesions    NEURO:  alert, approp, nl sensorium with  no motor or cerebellar deficits apparent.    CXR PA and Lateral:   10/17/2019 :    I personally reviewed images and   impression as follows:   He has marked ant eventration on R seen in 2013 and back to baseline s effusion       Assessment   Hemothorax, right-----??Dressler Type inflammation Multiple rib fx 08/15/19 - R thorcentesis 09/05/2019 old blood x 1.2 liters and ESR 99 > rx short pred rx and  repeat cxr 10/17/2019 with eventration (same as 2013) only / no effusion > no directed f/u needed    Cigarette smoker Congratulated on maintaining off cigs    I reviewed the Fletcher curve with the patient that basically indicates  if you quit smoking when your best day FEV1 is still well preserved (as is clearly  the case here)  it is highly unlikely you will progress to severe disease and informed the patient there was  no medication on the market that has proven to alter the curve/ its downward trajectory  or the likelihood of progression of their disease(unlike other chronic medical conditions such as atheroclerosis where we do think we can change the natural hx with risk reducing meds)    Therefore stopping smoking and maintaining abstinence are  the most important aspects of his care, not choice of inhalers or for that matter, doctors.   Treatment other than smoking cessation  is entirely directed by severity of symptoms and focused also on reducing exacerbations, not attempting to change the natural history of the disease.    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  Total time for H and P, chart review, counseling, teaching device and generating customized AVS unique to this post hosp f/u  office visit / charting = 30 min       Christinia Gully, MD 10/17/2019

## 2019-10-17 NOTE — Assessment & Plan Note (Signed)
See cxr  06/19/11 > no change vs 10/17/2019 post R cw trauma  Reviewed findings with pt/ no directed f/u needed

## 2019-10-17 NOTE — Assessment & Plan Note (Signed)
Multiple rib fx 08/15/19 - R thorcentesis 09/05/2019 old blood x 1.2 liters and ESR 99 > rx short pred rx and  repeat cxr 10/17/2019 with eventration (same as 2013) only / no effusion > no directed f/u needed

## 2019-10-17 NOTE — Patient Instructions (Addendum)
Please remember to go to the  x-ray department at [facility] for your tests - we will call you with the results when they are available    Congratulations on not smoking   I strongly encourge you to get vaccinated at [facility] or [facility] based on proven safey and effectiveness against the Covid 19 infection including the Delta variant.  Needs f/u in 3 m with pfts

## 2019-10-18 NOTE — Progress Notes (Signed)
Called and left a detailed msg on machine ok per DPR

## 2019-10-24 ENCOUNTER — Telehealth: Payer: Self-pay | Admitting: *Deleted

## 2019-10-24 NOTE — Telephone Encounter (Signed)
Tried calling pt and there was no answer-LMTCB x 1 

## 2019-10-24 NOTE — Telephone Encounter (Signed)
-----   Message from Nyoka Cowden, MD sent at 10/19/2019  2:46 PM EDT ----- Needs gi referral for liver cyst found incidentally on u/s  09/05/19 - no rush

## 2019-11-01 NOTE — Telephone Encounter (Signed)
LMTCB x2  

## 2019-11-12 NOTE — Telephone Encounter (Signed)
LMTCB

## 2019-11-20 ENCOUNTER — Encounter: Payer: Self-pay | Admitting: *Deleted

## 2019-11-20 NOTE — Telephone Encounter (Signed)
Letter mailed to the pt to call

## 2019-11-30 ENCOUNTER — Telehealth: Payer: Self-pay | Admitting: Internal Medicine

## 2019-11-30 NOTE — Telephone Encounter (Signed)
Called and spoke with Dorcas Mcmurray per DPR at [phone] who states the patient has been having issues with his phone that's why we have not been able to reach him. Let her know that we sent a letter to let him know that we needed to refer him to see a GI doctor due to provider finding a cyst on his liver and wanting him to get it checked out. She stated that she would talk to the patient and let him know and they would call the office back if they wanted [name] to place the order for GI. I expressed understanding and told her to give [name] a call when ready. Nothing further needed at this time.

## 2020-11-23 ENCOUNTER — Emergency Department (HOSPITAL_COMMUNITY): Payer: Medicare Other

## 2020-11-23 ENCOUNTER — Inpatient Hospital Stay (HOSPITAL_COMMUNITY)
Admission: EM | Admit: 2020-11-23 | Discharge: 2020-11-28 | DRG: 482 | Disposition: A | Discharge status: Skilled Nursing Facility | Payer: Medicare Other | Attending: Internal Medicine | Admitting: Internal Medicine

## 2020-11-23 ENCOUNTER — Other Ambulatory Visit: Payer: Self-pay

## 2020-11-23 ENCOUNTER — Encounter (HOSPITAL_COMMUNITY): Payer: Self-pay | Admitting: Emergency Medicine

## 2020-11-23 DIAGNOSIS — Z20822 Contact with and (suspected) exposure to covid-19: Secondary | ICD-10-CM | POA: Diagnosis not present

## 2020-11-23 DIAGNOSIS — S72143A Displaced intertrochanteric fracture of unspecified femur, initial encounter for closed fracture: Secondary | ICD-10-CM | POA: Diagnosis not present

## 2020-11-23 DIAGNOSIS — Z720 Tobacco use: Secondary | ICD-10-CM | POA: Insufficient documentation

## 2020-11-23 DIAGNOSIS — Y92019 Unspecified place in single-family (private) house as the place of occurrence of the external cause: Secondary | ICD-10-CM | POA: Diagnosis not present

## 2020-11-23 DIAGNOSIS — Z880 Allergy status to penicillin: Secondary | ICD-10-CM | POA: Diagnosis not present

## 2020-11-23 DIAGNOSIS — Z7982 Long term (current) use of aspirin: Secondary | ICD-10-CM

## 2020-11-23 DIAGNOSIS — Z833 Family history of diabetes mellitus: Secondary | ICD-10-CM | POA: Diagnosis not present

## 2020-11-23 DIAGNOSIS — W19XXXA Unspecified fall, initial encounter: Principal | ICD-10-CM

## 2020-11-23 DIAGNOSIS — S72142A Displaced intertrochanteric fracture of left femur, initial encounter for closed fracture: Secondary | ICD-10-CM | POA: Diagnosis not present

## 2020-11-23 DIAGNOSIS — S72002D Fracture of unspecified part of neck of left femur, subsequent encounter for closed fracture with routine healing: Secondary | ICD-10-CM | POA: Diagnosis present

## 2020-11-23 DIAGNOSIS — Y93E9 Activity, other interior property and clothing maintenance: Secondary | ICD-10-CM | POA: Diagnosis not present

## 2020-11-23 DIAGNOSIS — W11XXXA Fall on and from ladder, initial encounter: Secondary | ICD-10-CM | POA: Diagnosis present

## 2020-11-23 DIAGNOSIS — M25552 Pain in left hip: Secondary | ICD-10-CM | POA: Diagnosis present

## 2020-11-23 DIAGNOSIS — Z9181 History of falling: Secondary | ICD-10-CM | POA: Diagnosis not present

## 2020-11-23 DIAGNOSIS — D72828 Other elevated white blood cell count: Secondary | ICD-10-CM | POA: Diagnosis not present

## 2020-11-23 DIAGNOSIS — F17210 Nicotine dependence, cigarettes, uncomplicated: Secondary | ICD-10-CM | POA: Diagnosis not present

## 2020-11-23 DIAGNOSIS — S72002A Fracture of unspecified part of neck of left femur, initial encounter for closed fracture: Secondary | ICD-10-CM

## 2020-11-23 LAB — COMPREHENSIVE METABOLIC PANEL
ALT: 14 U/L (ref 0–44)
AST: 18 U/L (ref 15–41)
Albumin: 4.3 g/dL (ref 3.5–5.0)
Alkaline Phosphatase: 63 U/L (ref 38–126)
Anion gap: 4 — ABNORMAL LOW (ref 5–15)
BUN: 15 mg/dL (ref 6–20)
CO2: 26 mmol/L (ref 22–32)
Calcium: 9 mg/dL (ref 8.9–10.3)
Chloride: 107 mmol/L (ref 98–111)
Creatinine, Ser: 0.79 mg/dL (ref 0.61–1.24)
GFR, Estimated: >60 mL/min (ref >60)
Glucose, Bld: 75 mg/dL (ref 70–99)
Potassium: 3.8 mmol/L (ref 3.5–5.1)
Sodium: 137 mmol/L (ref 135–145)
Total Bilirubin: 0.8 mg/dL (ref 0.3–1.2)
Total Protein: 7.1 g/dL (ref 6.5–8.1)

## 2020-11-23 LAB — CBC WITH DIFFERENTIAL/PLATELET
Abs Immature Granulocytes: 0.16 10*3/uL — ABNORMAL HIGH (ref 0.00–0.07)
Basophils Absolute: 0.1 10*3/uL (ref 0.0–0.1)
Basophils Relative: 1 %
Eosinophils Absolute: 0.1 10*3/uL (ref 0.0–0.5)
Eosinophils Relative: 0 %
HCT: 43.8 % (ref 39.0–52.0)
Hemoglobin: 14.8 g/dL (ref 13.0–17.0)
Immature Granulocytes: 1 %
Lymphocytes Relative: 6 %
Lymphs Abs: 1.1 10*3/uL (ref 0.7–4.0)
MCH: 32.6 pg (ref 26.0–34.0)
MCHC: 33.8 g/dL (ref 30.0–36.0)
MCV: 96.5 fL (ref 80.0–100.0)
Monocytes Absolute: 0.9 10*3/uL (ref 0.1–1.0)
Monocytes Relative: 5 %
Neutro Abs: 15.2 10*3/uL — ABNORMAL HIGH (ref 1.7–7.7)
Neutrophils Relative %: 87 %
Platelets: 213 10*3/uL (ref 150–400)
RBC: 4.54 MIL/uL (ref 4.22–5.81)
RDW: 12.4 % (ref 11.5–15.5)
WBC: 17.4 10*3/uL — ABNORMAL HIGH (ref 4.0–10.5)
nRBC: 0 % (ref 0.0–0.2)

## 2020-11-23 LAB — RESP PANEL BY RT-PCR (FLU A&B, COVID) ARPGX2
Influenza A by PCR: NEGATIVE
Influenza B by PCR: NEGATIVE
SARS Coronavirus 2 by RT PCR: NEGATIVE

## 2020-11-23 MED ORDER — HYDROMORPHONE HCL 1 MG/ML IJ SOLN
1.0000 mg | Freq: Once | INTRAMUSCULAR | Status: AC
  Administered 2020-11-23: 1 mg via INTRAVENOUS
  Filled 2020-11-23: qty 1

## 2020-11-23 MED ORDER — HYDROMORPHONE HCL 1 MG/ML IJ SOLN
0.5000 mg | Freq: Once | INTRAMUSCULAR | Status: AC
  Administered 2020-11-23: 0.5 mg via INTRAVENOUS
  Filled 2020-11-23: qty 1

## 2020-11-23 NOTE — ED Triage Notes (Signed)
Pt states he was changing lightbulbs while standing on "two-step ladder" and fell. Pt reports hitting head, small abrasion and knot noted behind left ear. Denies LOC. Pt more concerned about left leg, states he can not bear weight.

## 2020-11-23 NOTE — ED Notes (Signed)
Gave pt urinal 

## 2020-11-23 NOTE — ED Provider Notes (Signed)
[facility] EMERGENCY DEPARTMENT Provider Note   CSN: [id number] Arrival date & time: 11/23/20  1611     History Chief Complaint  Patient presents with   Fall    (From small ladder)    KORE MADLOCK is a 55 y.o. male.  Patient states she was on a ladder about 4 feet up and fell.  Patient complains of pain to his left hip.  He did not hit his head or lose consciousness  The history is provided by the patient and medical records. No language interpreter was used.  Fall This is a new problem. The current episode started less than 1 hour ago. The problem occurs rarely. The problem has been resolved. Pertinent negatives include no chest pain, no abdominal pain and no headaches. Exacerbated by: Movement of left hip. Nothing relieves the symptoms. He has tried nothing for the symptoms. The treatment provided no relief.      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   Hemothorax, right-----??[place] Type inflammation 09/05/2019   Multiple rib fractures 09/05/2019   Liver lesion-- Needs MRI as outpatient once breathing is better 09/05/2019   Cigarette smoker 09/05/2019   Trauma 08/15/2019    Past Surgical History:  Procedure Laterality Date   ABDOMINAL SURGERY         Family History  Problem Relation Age of Onset   Diabetes Father     Social History   Tobacco Use   Smoking status: Former    Types: Cigarettes    Quit date: 08/15/2019    Years since quitting: 1.2   Smokeless tobacco: Never  Vaping Use   Vaping Use: Never used  Substance Use Topics   Alcohol use: No   Drug use: No    Home Medications Prior to Admission medications   Medication Sig Start Date End Date Taking? Authorizing Provider  acetaminophen (TYLENOL) 325 MG tablet Take 2 tablets (650 mg total) by mouth every 6 (six)  hours as needed for mild pain (or Fever >/= 101). 09/06/19  Yes Emokpae, Courage, MD  aspirin 325 MG tablet Take 325 mg by mouth daily.   Yes [provider]    Allergies    Penicillins  Review of Systems   Review of Systems  Constitutional:  Negative for appetite change and fatigue.  HENT:  Negative for congestion, ear discharge and sinus pressure.   Eyes:  Negative for discharge.  Respiratory:  Negative for cough.   Cardiovascular:  Negative for chest pain.  Gastrointestinal:  Negative for abdominal pain and diarrhea.  Genitourinary:  Negative for frequency and hematuria.  Musculoskeletal:  Negative for back pain.       Left hip pain  Skin:  Negative for rash.  Neurological:  Negative for seizures and headaches.  Psychiatric/Behavioral:  Negative for hallucinations.    Physical Exam Updated Vital Signs BP (!) 141/97   Pulse 70   Temp 98.4 F (36.9 C) (Oral)   Resp 14   Ht 5\' 6"  (1.676 m)   Wt 65.8 kg   SpO2 98%   BMI 23.40 kg/m   Physical Exam Vitals reviewed.  Constitutional:      Appearance: He is well-developed.  HENT:     Head: Normocephalic.     Mouth/Throat:     Mouth:  Mucous membranes are moist.  Eyes:     General: No scleral icterus.    Conjunctiva/sclera: Conjunctivae normal.  Neck:     Thyroid: No thyromegaly.  Cardiovascular:     Rate and Rhythm: Normal rate and regular rhythm.     Heart sounds: No murmur heard.   No friction rub. No gallop.  Pulmonary:     Breath sounds: No stridor. No wheezing or rales.  Chest:     Chest wall: No tenderness.  Abdominal:     General: There is no distension.     Tenderness: There is no abdominal tenderness. There is no rebound.  Musculoskeletal:     Cervical back: Neck supple.     Comments: Tender left hip  Lymphadenopathy:     Cervical: No cervical adenopathy.  Skin:    Findings: No erythema or rash.  Neurological:     Mental Status: He is alert and oriented to person, place, and time.      Motor: No abnormal muscle tone.     Coordination: Coordination normal.  Psychiatric:        Behavior: Behavior normal.    ED Results / Procedures / Treatments   Labs (all labs ordered are listed, but only abnormal results are displayed) Labs Reviewed  CBC WITH DIFFERENTIAL/PLATELET - Abnormal; Notable for the following components:      Result Value   WBC 17.4 (*)    Neutro Abs 15.2 (*)    Abs Immature Granulocytes 0.16 (*)    All other components within normal limits  COMPREHENSIVE METABOLIC PANEL - Abnormal; Notable for the following components:   Anion gap 4 (*)    All other components within normal limits  RESP PANEL BY RT-PCR (FLU A&B, COVID) [id number]    EKG None  Radiology CT HEAD WO CONTRAST ( )  Result Date: 11/23/2020 CLINICAL DATA:  Head trauma, abnormal mental status (Age 21-64y); Neck trauma, dangerous injury mechanism (Age 36-64y) EXAM: CT HEAD WITHOUT CONTRAST CT CERVICAL SPINE WITHOUT CONTRAST TECHNIQUE: Multidetector CT imaging of the head and cervical spine was performed following the standard protocol without intravenous contrast. Multiplanar CT image reconstructions of the cervical spine were also generated. COMPARISON:  None. FINDINGS: CT HEAD FINDINGS Brain: No evidence of acute large vascular territory infarction, hemorrhage, hydrocephalus, extra-axial collection or mass lesion/mass effect. Mild atrophy. Vascular: No hyperdense vessel identified. Mild calcific atherosclerosis. Skull: No evidence of acute fracture. Sinuses/Orbits: Mild paranasal sinus mucosal thickening without air-fluid levels. No acute orbital findings. Other: No mastoid effusions. CT CERVICAL SPINE FINDINGS Alignment: No substantial sagittal subluxation. Mild levocurvature in the cervical spine and dextrocurvature of the cervicothoracic junction. Rotation of C1 on C2. 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 Soft tissues and spinal canal: No prevertebral fluid or swelling. No visible canal hematoma. Disc levels:  Mild-to-moderate multilevel degenerative disc disease. Upper chest: Visualized lung apices are clear. IMPRESSION: CT head: No evidence of acute intracranial abnormality. CT cervical spine: 1. No evidence of acute fracture. 2. Rotation of C1 on C2, most likely positional in the absence of a fixed torticollis. 3. 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 Electronically Signed   By: Feliberto Harts MD   On: 11/23/2020 18:49   CT Cervical Spine Wo Contrast  Result Date: 11/23/2020 CLINICAL DATA:  Head trauma, abnormal mental status (Age 4-64y); Neck trauma, dangerous injury  mechanism (Age 14-64y) EXAM: CT HEAD WITHOUT CONTRAST CT CERVICAL SPINE WITHOUT CONTRAST TECHNIQUE: Multidetector CT imaging of the head and cervical spine was performed following the standard protocol without intravenous contrast. Multiplanar CT image reconstructions of the cervical spine were also generated. COMPARISON:  None. FINDINGS: CT HEAD FINDINGS Brain: No evidence of acute large vascular territory infarction, hemorrhage, hydrocephalus, extra-axial collection or mass lesion/mass effect. Mild atrophy. Vascular: No hyperdense vessel identified. Mild calcific atherosclerosis. Skull: No evidence of acute fracture. Sinuses/Orbits: Mild paranasal sinus mucosal thickening without air-fluid levels. No acute orbital findings. Other: No mastoid effusions. CT CERVICAL SPINE FINDINGS Alignment: No substantial sagittal subluxation. Mild levocurvature in the cervical spine and dextrocurvature of the cervicothoracic junction. Rotation of C1 on C2. 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 Soft tissues and spinal canal: No  prevertebral fluid or swelling. No visible canal hematoma. Disc levels:  Mild-to-moderate multilevel degenerative disc disease. Upper chest: Visualized lung apices are clear. IMPRESSION: CT head: No evidence of acute intracranial abnormality. CT cervical spine: 1. No evidence of acute fracture. 2. Rotation of C1 on C2, most likely positional in the absence of a fixed torticollis. 3. 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 Electronically Signed   By: Feliberto Harts MD   On: 11/23/2020 18:49   DG Pelvis Portable  Result Date: 11/23/2020 CLINICAL DATA:  Patient fell off 2 step ladder.  LEFT hip pain. EXAM: PORTABLE PELVIS 1-2 VIEWS COMPARISON:  10/04/2014 FINDINGS: There is a comminuted intertrochanteric fracture of the LEFT hip associated with minimal displacement. Remainder of the pelvis is intact. IMPRESSION: Intertrochanteric fracture of the LEFT hip. Electronically Signed   By: Norva Pavlov M.D.   On: 11/23/2020 17:41   DG Chest Port 1 View  Result Date: 11/23/2020 CLINICAL DATA:  Larey Seat off 2 step ladder. EXAM: PORTABLE CHEST 1 VIEW COMPARISON:  10/17/2019 FINDINGS: Heart size is normal. Lungs are clear. No pneumothorax. Numerous remote RIGHT rib fractures are present. No acute fracture. Degenerative changes are seen in the thoracolumbar spine. IMPRESSION: No evidence for acute  abnormality.  Remote RIGHT rib fractures. Electronically Signed   By: Norva Pavlov M.D.   On: 11/23/2020 17:42   DG HIP UNILAT WITH PELVIS 2-3 VIEWS LEFT  Result Date: 11/23/2020 CLINICAL DATA:  Left hip pain, fall EXAM: DG HIP (WITH OR WITHOUT PELVIS) 2-3V LEFT COMPARISON:  None. FINDINGS: There is a left femoral intertrochanteric fracture. Minimal displacement. No subluxation or dislocation. IMPRESSION: Left femoral intertrochanteric fracture. Electronically Signed   By: Charlett Nose M.D.   On: 11/23/2020  19:06    Procedures Procedures   Medications Ordered in ED Medications  HYDROmorphone (DILAUDID) injection 1 mg (has no administration in time range)  HYDROmorphone (DILAUDID) injection 0.5 mg (0.5 mg Intravenous Given 11/23/20 1713)  HYDROmorphone (DILAUDID) injection 1 mg (1 mg Intravenous Given 11/23/20 1733)    ED Course  I have reviewed the triage vital signs and the nursing notes.  Pertinent labs & imaging results that were available during my care of the patient were reviewed by me and considered in my medical decision making (see chart for details). Patient with an inotrope fracture left hip.  I spoke with Dr. August Saucer orthopedics who is on-call.  He stated that he has a patient with a fractured hip in our emergency department for the last 12 hours trying to  [facility].  He suggested having medicine admit the patient and consulting with Ortho on-call tomorrow which is Dr. Romeo AppleHarrison.  I contacted the hospitalist and they will admit the patient   MDM Rules/Calculators/A&P                           Patient will be admitted for fractured left hip Final Clinical Impression(s) / ED Diagnoses Final diagnoses:  Fall  Fall, initial encounter    Rx / DC Orders ED Discharge Orders     None        Bethann BerkshireZammit, Dave Mergen, MD 11/23/20 2036

## 2020-11-23 NOTE — ED Notes (Signed)
Portable xrays in process.

## 2020-11-23 NOTE — ED Notes (Signed)
Pt here with reports of falling off 2 step ladder, landing on his side, hitting back of left side of head and c/o left hip/upper thigh pain, 10/10, jeans removed, no obvious deformity, pt does have contusion to back of left ear and scant dried blood. Pt is alert and oriented x 4. Pt denies LOC.

## 2020-11-23 NOTE — H&P (Signed)
History and Physical    JUNIEL GROENE EBX:[id number] DOB: 1966-03-10 DOA: 11/23/2020  PCP: Patient, No Pcp Per (Inactive)   Patient coming from: Home  I have personally briefly reviewed patient's old medical records in [facility]  Chief Complaint: Fall  HPI: REAKWON BARREN is a 55 y.o. male with medical history significant for tobacco abuse, mechanical fall in 2021 from intake while reaching and trying to paint, with subsequent multiple rib fractures, right hemothorax.   Patient presented to the ED again today with reports of a fall.  Patient's fell from a 2 step ladder while changing light bulbs.  Patient hit his head, and reports pain in his left leg. He denies dizziness, chest pain and difficulty breathing.  Patient reports he fell because he was holding a bulb with 1 hand, and trying to change a light bulb with the other hand, with both hands occupied, he lost his balance and fell.  ED Course: Stable vitals.  WBC 17.4. Pelvic  x-ray shows left femoral intertrochanteric fracture. EDP talked to Dr. August Saucer on-call for orthopedics, reports no beds available, recommended patient be admitted here at [facility], and to consult Dr. Romeo Apple in the morning, hopefully this can be taken care of here.  Review of Systems: As per HPI all other systems reviewed and negative.  Past Medical History:  Diagnosis Date   Head injury    Rib fractures     Past Surgical History:  Procedure Laterality Date   ABDOMINAL SURGERY       reports that he quit smoking about 15 months ago. His smoking use included cigarettes. He has never used smokeless tobacco. He reports that he does not drink alcohol and does not use drugs.  Allergies  Allergen Reactions   Penicillins     Unknown reaction     Family History  Problem Relation Age of Onset   Diabetes Father    Prior to Admission medications   Medication Sig Start Date End Date Taking? Authorizing Provider  acetaminophen (TYLENOL) 325 MG tablet  Take 2 tablets (650 mg total) by mouth every 6 (six) hours as needed for mild pain (or Fever >/= 101). 09/06/19  Yes Marlyn Rabine, Courage, MD  aspirin 325 MG tablet Take 325 mg by mouth daily.   Yes [provider]    Physical Exam: Vitals:   11/23/20 1800 11/23/20 1830 11/23/20 1845 11/23/20 2000  BP: (!) 139/95 (!) 158/99  (!) 141/97  Pulse:  76 74 70  Resp: 16 14 16 14   Temp:      TempSrc:      SpO2:  98% 100% 98%  Weight:      Height:        Constitutional: NAD, calm, comfortable Vitals:   11/23/20 1800 11/23/20 1830 11/23/20 1845 11/23/20 2000  BP: (!) 139/95 (!) 158/99  (!) 141/97  Pulse:  76 74 70  Resp: 16 14 16 14   Temp:      TempSrc:      SpO2:  98% 100% 98%  Weight:      Height:       Eyes: PERRL, lids and conjunctivae normal ENMT: Mucous membranes are moist.   Neck: normal, supple, no masses, no thyromegaly Respiratory: clear to auscultation bilaterally, no wheezing, no crackles. Normal respiratory effort. No accessory muscle use.  Cardiovascular: Regular rate and rhythm, no murmurs / rubs / gallops. No extremity edema. 2+ pedal pulses. No carotid bruits.  Abdomen: no tenderness, no masses palpated. No hepatosplenomegaly.  Bowel sounds positive.  Musculoskeletal: no clubbing / cyanosis.  Skin: no rashes, lesions, ulcers. No induration Neurologic: No apparent cranial nerve abnormality.  Strength left lower extremity not tested.  Moving other extremities spontaneously. Psychiatric: Normal judgment and insight. Alert and oriented x 3. Normal mood.   Labs on Admission: I have personally reviewed following labs and imaging studies  CBC: Recent Labs  Lab 11/23/20 1640  WBC 17.4*  NEUTROABS 15.2*  HGB 14.8  HCT 43.8  MCV 96.5  PLT 213   Basic Metabolic Panel: Recent Labs  Lab 11/23/20 1640  NA 137  K 3.8  CL 107  CO2 26  GLUCOSE 75  BUN 15  CREATININE 0.79  CALCIUM 9.0   Liver Function Tests: Recent Labs  Lab 11/23/20 1640  AST 18  ALT 14   ALKPHOS 63  BILITOT 0.8  PROT 7.1  ALBUMIN 4.3   Radiological Exams on Admission: CT HEAD WO CONTRAST ( )  Result Date: 11/23/2020 CLINICAL DATA:  Head trauma, abnormal mental status (Age 19-64y); Neck trauma, dangerous injury mechanism (Age 59-64y) EXAM: CT HEAD WITHOUT CONTRAST CT CERVICAL SPINE WITHOUT CONTRAST TECHNIQUE: Multidetector CT imaging of the head and cervical spine was performed following the standard protocol without intravenous contrast. Multiplanar CT image reconstructions of the cervical spine were also generated. COMPARISON:  None. FINDINGS: CT HEAD FINDINGS Brain: No evidence of acute large vascular territory infarction, hemorrhage, hydrocephalus, extra-axial collection or mass lesion/mass effect. Mild atrophy. Vascular: No hyperdense vessel identified. Mild calcific atherosclerosis. Skull: No evidence of acute fracture. Sinuses/Orbits: Mild paranasal sinus mucosal thickening without air-fluid levels. No acute orbital findings. Other: No mastoid effusions. CT CERVICAL SPINE FINDINGS Alignment: No substantial sagittal subluxation. Mild levocurvature in the cervical spine and dextrocurvature of the cervicothoracic junction. Rotation of C1 on C2. 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 Soft tissues and spinal canal: No prevertebral fluid or swelling. No visible canal hematoma. Disc levels:  Mild-to-moderate multilevel degenerative disc disease. Upper chest: Visualized lung apices are clear. IMPRESSION: CT head: No evidence of acute intracranial abnormality. CT cervical spine: 1. No evidence of acute fracture. 2. Rotation of C1 on C2, most likely positional in the absence of a fixed torticollis. 3. 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 Electronically Signed   By: Feliberto Harts MD   On: 11/23/2020 18:49   CT Cervical Spine Wo Contrast  Result Date: 11/23/2020 CLINICAL DATA:  Head trauma, abnormal mental status (Age 67-64y); Neck trauma, dangerous injury mechanism (Age 58-64y) EXAM: CT HEAD WITHOUT CONTRAST CT CERVICAL SPINE WITHOUT CONTRAST TECHNIQUE: Multidetector CT imaging of the head and cervical spine was performed following the standard protocol without intravenous contrast. Multiplanar CT image reconstructions of the cervical spine were also generated. COMPARISON:  None. FINDINGS: CT HEAD FINDINGS Brain: No evidence of acute large vascular territory infarction, hemorrhage, hydrocephalus, extra-axial collection or mass lesion/mass effect. Mild atrophy. Vascular: No hyperdense vessel identified. Mild calcific atherosclerosis. Skull: No evidence of acute fracture. Sinuses/Orbits: Mild paranasal sinus mucosal thickening without air-fluid levels. No acute orbital findings. Other: No mastoid effusions. CT CERVICAL SPINE FINDINGS Alignment: No substantial sagittal subluxation. Mild levocurvature in the cervical spine and dextrocurvature of the cervicothoracic junction. Rotation of C1 on C2. 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 Soft tissues  and spinal canal: No prevertebral fluid or swelling. No visible canal hematoma. Disc levels:  Mild-to-moderate multilevel degenerative disc disease. Upper chest: Visualized lung apices are clear. IMPRESSION: CT head: No evidence of acute intracranial abnormality. CT cervical spine: 1. No evidence of acute fracture. 2. Rotation of C1 on C2, most likely positional in the absence of a fixed torticollis. 3. 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 Electronically Signed   By: Feliberto Harts MD   On: 11/23/2020 18:49   DG Pelvis  Portable  Result Date: 11/23/2020 CLINICAL DATA:  Patient fell off 2 step ladder.  LEFT hip pain. EXAM: PORTABLE PELVIS 1-2 VIEWS COMPARISON:  10/04/2014 FINDINGS: There is a comminuted intertrochanteric fracture of the LEFT hip associated with minimal displacement. Remainder of the pelvis is intact. IMPRESSION: Intertrochanteric fracture of the LEFT hip. Electronically Signed   By: Norva Pavlov M.D.   On: 11/23/2020 17:41   DG Chest Port 1 View  Result Date: 11/23/2020 CLINICAL DATA:  Larey Seat off 2 step ladder. EXAM: PORTABLE CHEST 1 VIEW COMPARISON:  10/17/2019 FINDINGS: Heart size is normal. Lungs are clear. No pneumothorax. Numerous remote RIGHT rib fractures are present. No acute fracture. Degenerative changes are seen in the thoracolumbar spine. IMPRESSION: No evidence for acute  abnormality.  Remote RIGHT rib fractures. Electronically Signed   By: Norva Pavlov M.D.   On: 11/23/2020 17:42   DG HIP UNILAT WITH PELVIS 2-3 VIEWS LEFT  Result Date: 11/23/2020 CLINICAL DATA:  Left hip pain, fall EXAM: DG HIP (WITH OR WITHOUT PELVIS) 2-3V LEFT COMPARISON:  None. FINDINGS: There is a left femoral intertrochanteric fracture. Minimal displacement. No subluxation or dislocation. IMPRESSION: Left femoral intertrochanteric fracture. Electronically Signed   By: Charlett Nose M.D.   On: 11/23/2020 19:06    EKG: None.  Assessment/Plan Principal Problem:   Closed left hip fracture (HCC) Active Problems:   Cigarette smoker   Left hip fracture s/p mechanical fall from ladder- pelvic x-ray shows left femoral intertrochanteric fracture.  Head and cervical CT done without acute intracranial abnormality or fracture.   - EDP talked with Dr. August Saucer, no beds available, recommends admission here, Dr. Romeo Apple to be consulted in the morning, as he will be available. -IV morphine 4 mg as needed -N.p.o. midnight -Obtain pre-op EKG  Leukocytosis-17.4.  No source of infection identified at this time. -  Trend  Tobacco abuse- 1PPD.  Not ready to quit - Nicotine patch  DVT prophylaxis: SCDs Code Status: Full code Family Communication: Brother at bedside Disposition Plan: > 2 days Consults called: Ortho Admission status: Inpt, med surg I certify that at the point of admission it is my clinical judgment that the patient will require inpatient hospital care spanning beyond 2 midnights from the point of admission due to high intensity of service, high risk for further deterioration and high frequency of surveillance required.   Onnie Boer MD [facility]  11/23/2020, 9:44 PM

## 2020-11-24 DIAGNOSIS — S72142A Displaced intertrochanteric fracture of left femur, initial encounter for closed fracture: Secondary | ICD-10-CM | POA: Diagnosis not present

## 2020-11-24 DIAGNOSIS — S72002A Fracture of unspecified part of neck of left femur, initial encounter for closed fracture: Secondary | ICD-10-CM | POA: Diagnosis not present

## 2020-11-24 DIAGNOSIS — F17210 Nicotine dependence, cigarettes, uncomplicated: Secondary | ICD-10-CM

## 2020-11-24 LAB — CBC
HCT: 42.4 % (ref 39.0–52.0)
Hemoglobin: 14.2 g/dL (ref 13.0–17.0)
MCH: 32.7 pg (ref 26.0–34.0)
MCHC: 33.5 g/dL (ref 30.0–36.0)
MCV: 97.7 fL (ref 80.0–100.0)
Platelets: 186 10*3/uL (ref 150–400)
RBC: 4.34 MIL/uL (ref 4.22–5.81)
RDW: 12.6 % (ref 11.5–15.5)
WBC: 14 10*3/uL — ABNORMAL HIGH (ref 4.0–10.5)
nRBC: 0 % (ref 0.0–0.2)

## 2020-11-24 LAB — BASIC METABOLIC PANEL
Anion gap: 5 (ref 5–15)
BUN: 19 mg/dL (ref 6–20)
CO2: 24 mmol/L (ref 22–32)
Calcium: 8.5 mg/dL — ABNORMAL LOW (ref 8.9–10.3)
Chloride: 107 mmol/L (ref 98–111)
Creatinine, Ser: 0.75 mg/dL (ref 0.61–1.24)
GFR, Estimated: >60 mL/min (ref >60)
Glucose, Bld: 102 mg/dL — ABNORMAL HIGH (ref 70–99)
Potassium: 3.9 mmol/L (ref 3.5–5.1)
Sodium: 136 mmol/L (ref 135–145)

## 2020-11-24 LAB — SURGICAL PCR SCREEN
MRSA, PCR: NEGATIVE
Staphylococcus aureus: NEGATIVE

## 2020-11-24 LAB — HIV ANTIBODY (ROUTINE TESTING W REFLEX): HIV Screen 4th Generation wRfx: NONREACTIVE

## 2020-11-24 MED ORDER — ONDANSETRON HCL 4 MG/2ML IJ SOLN: 4.0000 mg | Freq: Four times a day (QID) | INTRAMUSCULAR | Status: DC | PRN

## 2020-11-24 MED ORDER — HYDROMORPHONE HCL 1 MG/ML IJ SOLN
1.0000 mg | INTRAMUSCULAR | Status: DC | PRN
  Administered 2020-11-24 – 2020-11-25 (×6): 1 mg via INTRAVENOUS
  Filled 2020-11-24 (×6): qty 1

## 2020-11-24 MED ORDER — HYDROMORPHONE HCL 1 MG/ML IJ SOLN
1.0000 mg | INTRAMUSCULAR | Status: AC | PRN
  Administered 2020-11-24 (×3): 1 mg via INTRAVENOUS
  Filled 2020-11-24 (×3): qty 1

## 2020-11-24 MED ORDER — ACETAMINOPHEN 650 MG RE SUPP: 650.0000 mg | Freq: Four times a day (QID) | RECTAL | Status: DC | PRN

## 2020-11-24 MED ORDER — NICOTINE 14 MG/24HR TD PT24
14.0000 mg | MEDICATED_PATCH | Freq: Every day | TRANSDERMAL | Status: DC
  Administered 2020-11-24 – 2020-11-25 (×2): 14 mg via TRANSDERMAL
  Filled 2020-11-24 (×2): qty 1

## 2020-11-24 MED ORDER — VANCOMYCIN HCL IN DEXTROSE 1-5 GM/200ML-% IV SOLN
1000.0000 mg | INTRAVENOUS | Status: AC
  Administered 2020-11-25: 1000 mg via INTRAVENOUS
  Filled 2020-11-24: qty 200

## 2020-11-24 MED ORDER — CHLORHEXIDINE GLUCONATE 4 % EX LIQD
60.0000 mL | Freq: Once | CUTANEOUS | Status: DC
  Filled 2020-11-24: qty 15

## 2020-11-24 MED ORDER — ONDANSETRON HCL 4 MG PO TABS: 4.0000 mg | ORAL_TABLET | Freq: Four times a day (QID) | ORAL | Status: DC | PRN

## 2020-11-24 MED ORDER — POLYETHYLENE GLYCOL 3350 17 G PO PACK: 17.0000 g | PACK | Freq: Every day | ORAL | Status: DC | PRN

## 2020-11-24 MED ORDER — ACETAMINOPHEN 325 MG PO TABS: 650.0000 mg | ORAL_TABLET | Freq: Four times a day (QID) | ORAL | Status: DC | PRN

## 2020-11-24 MED ORDER — POVIDONE-IODINE 10 % EX SWAB: 2.0000 "application " | Freq: Once | CUTANEOUS | Status: DC

## 2020-11-24 NOTE — Progress Notes (Signed)
[facility] night shift MedSurg coverage note.  The nursing staff communicated to [name] that the patient is requesting as needed analgesics.  He received hydromorphone earlier with very good results. Hydromorphone 1 mg every 3 hours as needed ordered since it has worked very well for the patient.  Signs and Held morphine order canceled.  Caleb Blake is coming due.

## 2020-11-24 NOTE — H&P (View-Only) (Signed)
HOSPITAL CONSULT  [facility]   Patient ID: Caleb Blake, male   DOB: 1966-01-30, 55 y.o.   MRN: [id number]  New patient  Requested SH:FWY[facility] MD   Reason for: LEFT HIP FRAX   Based on the information below I recommend ORIF LEFT HIP WITH IM NAIL *  Chief Complaint  Patient presents with   Fall    (From small ladder)     HPI  I FELL OFF A SMALL LADDER REPLACING A LIGHT BULB PAI LEFT HIP  DOI 11/24/20 10 ACHE MOVEMENT 10/10    Review of Systems (all) Review of Systems  Musculoskeletal:  Positive for joint pain.  All other systems reviewed and are negative.  Past Medical History:  Diagnosis Date   Head injury    Rib fractures     Past Surgical History:  Procedure Laterality Date   ABDOMINAL SURGERY      Family History  Problem Relation Age of Onset   Diabetes Father    Social History   Tobacco Use   Smoking status: Former    Types: Cigarettes    Quit date: 08/15/2019    Years since quitting: 1.2   Smokeless tobacco: Never  Vaping Use   Vaping Use: Never used  Substance Use Topics   Alcohol use: No   Drug use: No   Allergies  Allergen Reactions   Penicillins     Unknown reaction     Current Facility-Administered Medications:    acetaminophen (TYLENOL) tablet 650 mg, 650 mg, Oral, Q6H PRN **OR** acetaminophen (TYLENOL) suppository 650 mg, 650 mg, Rectal, Q6H PRN, Emokpae, Ejiroghene E, MD   HYDROmorphone (DILAUDID) injection 1 mg, 1 mg, Intravenous, Q3H PRN, Osvaldo Shipper, MD   nicotine (NICODERM CQ - dosed in mg/24 hours) patch 14 mg, 14 mg, Transdermal, Daily, Emokpae, Ejiroghene E, MD, 14 mg at 11/24/20 0936   ondansetron (ZOFRAN) tablet 4 mg, 4 mg, Oral, Q6H PRN **OR** ondansetron (ZOFRAN) injection 4 mg, 4 mg, Intravenous, Q6H PRN, Emokpae, Ejiroghene E, MD   polyethylene glycol (MIRALAX / GLYCOLAX) packet 17 g, 17 g, Oral, Daily PRN, Emokpae, Ejiroghene E, MD    Physical Exam(=30) BP (!) 145/90 (BP Location: Left Arm)    Pulse 66   Temp 98.6 F (37 C)   Resp 16   Ht 5\' 6"  (1.676 m)   Wt 65.8 kg   SpO2 99%   BMI 23.40 kg/m   Gen. Appearance NORMAL ECTOMORPHIC  Peripheral vascular system NORMAL NO EDEMA GOOD PULSES  Lymph nodes ARE NORMAL  Gait CAN NOT WALK   Left Upper extremity  Inspection revealed no malalignment or asymmetry  Assessment of range of motion: Full range of motion was recorded  Assessment of stability: Elbow wrist and hand and shoulder were stable  Assessment of muscle strength and tone revealed grade 5 muscle strength and normal muscle tone  Skin was normal without rash lesion or ulceration  Right upper extremity  Inspection revealed no malalignment or asymmetry  Assessment of range of motion: Full range of motion was recorded  Assessment of stability: Elbow wrist and hand and shoulder were stable  Assessment of muscle strength and tone revealed grade 5 muscle strength and normal muscle tone  Skin was normal without rash lesion or ulceration  Right Lower extremity  Inspection revealed no malalignment or asymmetry  Assessment of range of motion: Full range of motion was recorded  Assessment of stability: Ankle, knee and hip were stable  Assessment of muscle strength and  tone revealed grade 5 muscle strength and normal muscle tone  Skin was normal without rash lesion or ulceration  Left lower extremity Inspection revealed  malalignment AND asymmetry, LEFT LEG SHORT EXT ROTATION  Assessment of range of motion: DEFER FRX  Assessment of stability: Ankle, knee  stable Assessment of muscle strength and tone revealed grade 5 muscle strength and normal muscle tone FOOT  Skin was normal without rash lesion or ulceration  Coordination was tested by finger-to-nose nose and was normal Deep tendon reflexes were 2+ in the upper extremities and DEFER LOWER EXTREMS DUE TO FRX  Examination of sensation by touch was normal  Mental status  Oriented to time person and place normal  Mood  and affect normal without depression anxiety or agitation  Dx:   Data Reviewed  ER RECORD REVIEWED: CONFIRMS HISTORY   I reviewed the following images and the reports and my independent interpretation is INTERNAL FIXATION LEFT HIP    Assessment  LEFT HIP 2 PT STABLE IT FRACTURE   Plan   INT FIX W GAMMA NAIL   The procedure has been fully reviewed with the patient; The risks and benefits of surgery have been discussed and explained and understood. Alternative treatment has also been reviewed, questions were encouraged and answered. The postoperative plan is also been reviewed.    Jesaiah Fabiano E Yago Ludvigsen MD    

## 2020-11-24 NOTE — Consult Note (Addendum)
HOSPITAL CONSULT  [facility]   Patient ID: Caleb Blake, male   DOB: 1966-01-30, 55 y.o.   MRN: [id number]  New patient  Requested SH:FW[facility] MD   Reason for: LEFT HIP FRAX   Based on the information below I recommend ORIF LEFT HIP WITH IM NAIL *  Chief Complaint  Patient presents with   Fall    (From small ladder)     HPI  I FELL OFF A SMALL LADDER REPLACING A LIGHT BULB PAI LEFT HIP  DOI 11/24/20 10 ACHE MOVEMENT 10/10    Review of Systems (all) Review of Systems  Musculoskeletal:  Positive for joint pain.  All other systems reviewed and are negative.  Past Medical History:  Diagnosis Date   Head injury    Rib fractures     Past Surgical History:  Procedure Laterality Date   ABDOMINAL SURGERY      Family History  Problem Relation Age of Onset   Diabetes Father    Social History   Tobacco Use   Smoking status: Former    Types: Cigarettes    Quit date: 08/15/2019    Years since quitting: 1.2   Smokeless tobacco: Never  Vaping Use   Vaping Use: Never used  Substance Use Topics   Alcohol use: No   Drug use: No   Allergies  Allergen Reactions   Penicillins     Unknown reaction     Current Facility-Administered Medications:    acetaminophen (TYLENOL) tablet 650 mg, 650 mg, Oral, Q6H PRN **OR** acetaminophen (TYLENOL) suppository 650 mg, 650 mg, Rectal, Q6H PRN, Emokpae, Ejiroghene E, MD   HYDROmorphone (DILAUDID) injection 1 mg, 1 mg, Intravenous, Q3H PRN, Osvaldo Shipper, MD   nicotine (NICODERM CQ - dosed in mg/24 hours) patch 14 mg, 14 mg, Transdermal, Daily, Emokpae, Ejiroghene E, MD, 14 mg at 11/24/20 0936   ondansetron (ZOFRAN) tablet 4 mg, 4 mg, Oral, Q6H PRN **OR** ondansetron (ZOFRAN) injection 4 mg, 4 mg, Intravenous, Q6H PRN, Emokpae, Ejiroghene E, MD   polyethylene glycol (MIRALAX / GLYCOLAX) packet 17 g, 17 g, Oral, Daily PRN, Emokpae, Ejiroghene E, MD    Physical Exam(=30) BP (!) 145/90 (BP Location: Left Arm)    Pulse 66   Temp 98.6 F (37 C)   Resp 16   Ht 5\' 6"  (1.676 m)   Wt 65.8 kg   SpO2 99%   BMI 23.40 kg/m   Gen. Appearance NORMAL ECTOMORPHIC  Peripheral vascular system NORMAL NO EDEMA GOOD PULSES  Lymph nodes ARE NORMAL  Gait CAN NOT WALK   Left Upper extremity  Inspection revealed no malalignment or asymmetry  Assessment of range of motion: Full range of motion was recorded  Assessment of stability: Elbow wrist and hand and shoulder were stable  Assessment of muscle strength and tone revealed grade 5 muscle strength and normal muscle tone  Skin was normal without rash lesion or ulceration  Right upper extremity  Inspection revealed no malalignment or asymmetry  Assessment of range of motion: Full range of motion was recorded  Assessment of stability: Elbow wrist and hand and shoulder were stable  Assessment of muscle strength and tone revealed grade 5 muscle strength and normal muscle tone  Skin was normal without rash lesion or ulceration  Right Lower extremity  Inspection revealed no malalignment or asymmetry  Assessment of range of motion: Full range of motion was recorded  Assessment of stability: Ankle, knee and hip were stable  Assessment of muscle strength and  tone revealed grade 5 muscle strength and normal muscle tone  Skin was normal without rash lesion or ulceration  Left lower extremity Inspection revealed  malalignment AND asymmetry, LEFT LEG SHORT EXT ROTATION  Assessment of range of motion: DEFER FRX  Assessment of stability: Ankle, knee  stable Assessment of muscle strength and tone revealed grade 5 muscle strength and normal muscle tone FOOT  Skin was normal without rash lesion or ulceration  Coordination was tested by finger-to-nose nose and was normal Deep tendon reflexes were 2+ in the upper extremities and DEFER LOWER EXTREMS DUE TO FRX  Examination of sensation by touch was normal  Mental status  Oriented to time person and place normal  Mood  and affect normal without depression anxiety or agitation  Dx:   Data Reviewed  ER RECORD REVIEWED: CONFIRMS HISTORY   I reviewed the following images and the reports and my independent interpretation is INTERNAL FIXATION LEFT HIP    Assessment  LEFT HIP 2 PT STABLE IT FRACTURE   Plan   INT FIX W GAMMA NAIL   The procedure has been fully reviewed with the patient; The risks and benefits of surgery have been discussed and explained and understood. Alternative treatment has also been reviewed, questions were encouraged and answered. The postoperative plan is also been reviewed.    Vickki Hearing MD

## 2020-11-24 NOTE — Progress Notes (Addendum)
[facility] PROGRESS NOTE   CELEDONIO SORTINO XVQ:[id number] DOB: Sep 30, 1965 DOA: 11/23/2020  PCP: Patient, No Pcp Per (Inactive)  Brief History/Interval Summary: 55 y.o. male with medical history significant for tobacco abuse, mechanical fall in 2021 from intake while reaching and trying to paint, with subsequent multiple rib fractures, right hemothorax.  Presented after fall from a 2 step ladder.  He fell on his left side.  Evaluation revealed left intertrochanteric fracture.  Patient was hospitalized for further management.  Consultants: Orthopedics  Procedures: None yet  Antibiotics: Anti-infectives (From admission, onward)    None       Subjective/Interval History: Patient states that pain is currently 5-10 in intensity.  Denies any chest pain shortness of breath.  No history of heart disease.  He does not have any chronic medical problems.    Assessment/Plan:  Left hip fracture secondary to mechanical fall Discussed with Dr. Romeo Apple with orthopedics who will evaluate patient today but he does not think that patient will be able to undergo surgery today due to scheduling issues.  Patient with no chronic medical issues.  EKG does not show any concerning changes.  He is highly functional at baseline.  Blood work is unremarkable.  Chest x-ray does not show any concerning findings.  Old right rib fractures noted.  Patient may proceed to surgery without any cardiac testing at this time.  Based on the [place] perioperative risk index is estimated risk probability for perioperative cardiac complication is 0.02%.  Leukocytosis Most likely reactive.  No evidence for infection.  He is afebrile.  Tobacco abuse Counseling was provided.  He is not ready to quit.   DVT Prophylaxis: SCDs for now Code Status: Full code Family Communication: Discussed with the patient.  No family at bedside Disposition Plan: To be determined  Status is: Inpatient  Remains inpatient appropriate  because:Ongoing active pain requiring inpatient pain management and Inpatient level of care appropriate due to severity of illness  Dispo: The patient is from: Home              Anticipated d/c is to:  To be determined              Patient currently is not medically stable to d/c.   Difficult to place patient No         Medications: Scheduled:  nicotine  14 mg Transdermal Daily   Continuous: KDT:OIZTIWPYKDXIP **OR** acetaminophen, HYDROmorphone (DILAUDID) injection, ondansetron **OR** ondansetron (ZOFRAN) IV, polyethylene glycol   Objective:  Vital Signs  Vitals:   11/24/20 0600 11/24/20 0615 11/24/20 0630 11/24/20 0816  BP: 122/76  114/78 140/83  Pulse: (!) 59 62  (!) 58  Resp: 13 14 13 15   Temp:    98.6 F (37 C)  TempSrc:    Oral  SpO2: 95% 93%  96%  Weight:      Height:       No intake or output data in the 24 hours ending 11/24/20 0935 Filed Weights   11/23/20 1621  Weight: 65.8 kg    General appearance: Awake alert.  In no distress Resp: Clear to auscultation bilaterally.  Normal effort Cardio: S1-S2 is normal regular.  No S3-S4.  No rubs murmurs or bruit GI: Abdomen is soft.  Nontender nondistended.  Bowel sounds are present normal.  No masses organomegaly Extremities: left lower extremity is externally rotated.  Good peripheral pulses. Neurologic: Alert and oriented x3.  No focal neurological deficits.    Lab Results:  Data Reviewed: I  have personally reviewed following labs and imaging studies  CBC: Recent Labs  Lab 11/23/20 1640 11/24/20 0859  WBC 17.4* 14.0*  NEUTROABS 15.2*  --   HGB 14.8 14.2  HCT 43.8 42.4  MCV 96.5 97.7  PLT 213 186    Basic Metabolic Panel: Recent Labs  Lab 11/23/20 1640 11/24/20 0859  NA 137 136  K 3.8 3.9  CL 107 107  CO2 26 24  GLUCOSE 75 102*  BUN 15 19  CREATININE 0.79 0.75  CALCIUM 9.0 8.5*    GFR: Estimated Creatinine Clearance: 93 mL/min (by C-G formula based on SCr of 0.75 mg/dL).  Liver  Function Tests: Recent Labs  Lab 11/23/20 1640  AST 18  ALT 14  ALKPHOS 63  BILITOT 0.8  PROT 7.1  ALBUMIN 4.3      Recent Results (from the past 240 hour(s))  Resp Panel by RT-PCR (Flu A&B, Covid) Nasopharyngeal Swab     Status: None   Collection Time: 11/23/20  5:06 PM   Specimen: Nasopharyngeal Swab; Nasopharyngeal(NP) swabs in vial transport medium  Result Value Ref Range Status   SARS Coronavirus 2 by RT PCR NEGATIVE NEGATIVE Final    Comment: (NOTE) SARS-CoV-2 target nucleic acids are NOT DETECTED.  The SARS-CoV-2 RNA is generally detectable in upper respiratory specimens during the acute phase of infection. The lowest concentration of SARS-CoV-2 viral copies this assay can detect is 138 copies/mL. A negative result does not preclude SARS-Cov-2 infection and should not be used as the sole basis for treatment or other patient management decisions. A negative result may occur with  improper specimen collection/handling, submission of specimen other than nasopharyngeal swab, presence of viral mutation(s) within the areas targeted by this assay, and inadequate number of viral copies(<138 copies/mL). A negative result must be combined with clinical observations, patient history, and epidemiological information. The expected result is Negative.  Fact Sheet for Patients:  BloggerCourse.comhttps://www.fda.gov/media/152166/download  Fact Sheet for Healthcare Providers:  SeriousBroker.ithttps://www.fda.gov/media/152162/download  This test is no t yet approved or cleared by the [place] FDA and  has been authorized for detection and/or diagnosis of SARS-CoV-2 by FDA under an Emergency Use Authorization (EUA). This EUA will remain  in effect (meaning this test can be used) for the duration of the COVID-19 declaration under Section 564(b)(1) of the Act, 21 U.S.C.section 360bbb-3(b)(1), unless the authorization is terminated  or revoked sooner.       Influenza A by PCR NEGATIVE NEGATIVE Final    Influenza B by PCR NEGATIVE NEGATIVE Final    Comment: (NOTE) The Xpert Xpress SARS-CoV-2/FLU/RSV plus assay is intended as an aid in the diagnosis of influenza from Nasopharyngeal swab specimens and should not be used as a sole basis for treatment. Nasal washings and aspirates are unacceptable for Xpert Xpress SARS-CoV-2/FLU/RSV testing.  Fact Sheet for Patients: BloggerCourse.comhttps://www.fda.gov/media/152166/download  Fact Sheet for Healthcare Providers: SeriousBroker.ithttps://www.fda.gov/media/152162/download  This test is not yet approved or cleared by the [place] FDA and has been authorized for detection and/or diagnosis of SARS-CoV-2 by FDA under an Emergency Use Authorization (EUA). This EUA will remain in effect (meaning this test can be used) for the duration of the COVID-19 declaration under Section 564(b)(1) of the Act, 21 U.S.C. section 360bbb-3(b)(1), unless the authorization is terminated or revoked.  Performed at [facility], [address] [place], [place] [address]       Radiology Studies: CT HEAD WO CONTRAST (5MM)  Result Date: 11/23/2020 CLINICAL DATA:  Head trauma, abnormal mental status (Age 57-64y); Neck  trauma, dangerous injury mechanism (Age 67-64y) EXAM: CT HEAD WITHOUT CONTRAST CT CERVICAL SPINE WITHOUT CONTRAST TECHNIQUE: Multidetector CT imaging of the head and cervical spine was performed following the standard protocol without intravenous contrast. Multiplanar CT image reconstructions of the cervical spine were also generated. COMPARISON:  None. FINDINGS: CT HEAD FINDINGS Brain: No evidence of acute large vascular territory infarction, hemorrhage, hydrocephalus, extra-axial collection or mass lesion/mass effect. Mild atrophy. Vascular: No hyperdense vessel identified. Mild calcific atherosclerosis. Skull: No evidence of acute fracture. Sinuses/Orbits: Mild paranasal sinus mucosal thickening without air-fluid levels. No acute orbital findings. Other: No mastoid effusions. CT  CERVICAL SPINE FINDINGS Alignment: No substantial sagittal subluxation. Mild levocurvature in the cervical spine and dextrocurvature of the cervicothoracic junction. Rotation of C1 on C2. 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 Soft tissues and spinal canal: No prevertebral fluid or swelling. No visible canal hematoma. Disc levels:  Mild-to-moderate multilevel degenerative disc disease. Upper chest: Visualized lung apices are clear. IMPRESSION: CT head: No evidence of acute intracranial abnormality. CT cervical spine: 1. No evidence of acute fracture. 2. Rotation of C1 on C2, most likely positional in the absence of a fixed torticollis. 3. 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 Electronically Signed   By: Feliberto Harts MD   On: 11/23/2020 18:49   CT Cervical Spine Wo Contrast  Result Date: 11/23/2020 CLINICAL DATA:  Head trauma, abnormal mental status (Age 34-64y); Neck trauma, dangerous injury mechanism (Age 91-64y) EXAM: CT HEAD WITHOUT CONTRAST CT CERVICAL SPINE WITHOUT CONTRAST TECHNIQUE: Multidetector CT imaging of the head and cervical spine was performed following the standard protocol without intravenous contrast. Multiplanar CT image reconstructions of the cervical spine were also generated. COMPARISON:  None. FINDINGS: CT HEAD FINDINGS Brain: No evidence of acute large vascular territory infarction, hemorrhage, hydrocephalus, extra-axial collection or mass lesion/mass effect. Mild atrophy. Vascular: No hyperdense vessel identified. Mild calcific atherosclerosis. Skull: No evidence of acute fracture. Sinuses/Orbits: Mild paranasal sinus mucosal thickening without air-fluid levels. No acute orbital findings. Other: No mastoid effusions. CT CERVICAL SPINE FINDINGS Alignment: No  substantial sagittal subluxation. Mild levocurvature in the cervical spine and dextrocurvature of the cervicothoracic junction. Rotation of C1 on C2. 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 Soft tissues and spinal canal: No prevertebral fluid or swelling. No visible canal hematoma. Disc levels:  Mild-to-moderate multilevel degenerative disc disease. Upper chest: Visualized lung apices are clear. IMPRESSION: CT head: No evidence of acute intracranial abnormality. CT cervical spine: 1. No evidence of acute fracture. 2. Rotation of C1 on C2, most likely positional in the absence of a fixed torticollis. 3. 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 Electronically Signed   By: Feliberto Harts MD   On: 11/23/2020 18:49   DG Pelvis Portable  Result Date: 11/23/2020 CLINICAL DATA:  Patient fell off 2 step ladder.  LEFT hip pain. EXAM: PORTABLE PELVIS 1-2 VIEWS COMPARISON:  10/04/2014 FINDINGS: There is a comminuted intertrochanteric fracture of the LEFT hip associated with minimal displacement. Remainder of the pelvis is intact. IMPRESSION: Intertrochanteric fracture of the LEFT hip. Electronically Signed   By: Norva Pavlov M.D.   On: 11/23/2020 17:41   DG Chest Port 1 View  Result Date:  11/23/2020 CLINICAL DATA:  Larey Seat off 2 step ladder. EXAM: PORTABLE CHEST 1 VIEW COMPARISON:  10/17/2019 FINDINGS: Heart size is normal. Lungs are clear. No pneumothorax. Numerous remote RIGHT rib fractures are present. No acute fracture. Degenerative changes are seen in the thoracolumbar spine. IMPRESSION: No evidence for acute  abnormality.  Remote RIGHT rib fractures. Electronically Signed   By: Norva Pavlov M.D.   On: 11/23/2020 17:42   DG HIP UNILAT WITH PELVIS 2-3 VIEWS LEFT  Result Date: 11/23/2020 CLINICAL  DATA:  Left hip pain, fall EXAM: DG HIP (WITH OR WITHOUT PELVIS) 2-3V LEFT COMPARISON:  None. FINDINGS: There is a left femoral intertrochanteric fracture. Minimal displacement. No subluxation or dislocation. IMPRESSION: Left femoral intertrochanteric fracture. Electronically Signed   By: Charlett Nose M.D.   On: 11/23/2020 19:06       LOS: 1 day   [employer]  [facility] Pager on [email or web address]  11/24/2020, 9:35 AM

## 2020-11-25 ENCOUNTER — Other Ambulatory Visit: Payer: Self-pay

## 2020-11-25 ENCOUNTER — Encounter (HOSPITAL_COMMUNITY)
Admission: EM | Disposition: A | Discharge status: Skilled Nursing Facility | Payer: Self-pay | Source: Home / Self Care | Attending: Internal Medicine

## 2020-11-25 ENCOUNTER — Inpatient Hospital Stay (HOSPITAL_COMMUNITY): Payer: Medicare Other

## 2020-11-25 ENCOUNTER — Encounter (HOSPITAL_COMMUNITY): Payer: Self-pay | Admitting: Internal Medicine

## 2020-11-25 ENCOUNTER — Inpatient Hospital Stay (HOSPITAL_COMMUNITY): Payer: Medicare Other | Admitting: Certified Registered"

## 2020-11-25 DIAGNOSIS — W11XXXA Fall on and from ladder, initial encounter: Secondary | ICD-10-CM

## 2020-11-25 DIAGNOSIS — S72142A Displaced intertrochanteric fracture of left femur, initial encounter for closed fracture: Secondary | ICD-10-CM | POA: Diagnosis not present

## 2020-11-25 DIAGNOSIS — S72143A Displaced intertrochanteric fracture of unspecified femur, initial encounter for closed fracture: Secondary | ICD-10-CM

## 2020-11-25 HISTORY — PX: INTRAMEDULLARY (IM) NAIL INTERTROCHANTERIC: SHX5875

## 2020-11-25 LAB — CBC
HCT: 40.3 % (ref 39.0–52.0)
Hemoglobin: 13.5 g/dL (ref 13.0–17.0)
MCH: 32.5 pg (ref 26.0–34.0)
MCHC: 33.5 g/dL (ref 30.0–36.0)
MCV: 96.9 fL (ref 80.0–100.0)
Platelets: 179 10*3/uL (ref 150–400)
RBC: 4.16 MIL/uL — ABNORMAL LOW (ref 4.22–5.81)
RDW: 12.7 % (ref 11.5–15.5)
WBC: 12.4 10*3/uL — ABNORMAL HIGH (ref 4.0–10.5)
nRBC: 0 % (ref 0.0–0.2)

## 2020-11-25 LAB — BASIC METABOLIC PANEL
Anion gap: 9 (ref 5–15)
BUN: 17 mg/dL (ref 6–20)
CO2: 24 mmol/L (ref 22–32)
Calcium: 8.6 mg/dL — ABNORMAL LOW (ref 8.9–10.3)
Chloride: 103 mmol/L (ref 98–111)
Creatinine, Ser: 0.72 mg/dL (ref 0.61–1.24)
GFR, Estimated: >60 mL/min (ref >60)
Glucose, Bld: 96 mg/dL (ref 70–99)
Potassium: 3.9 mmol/L (ref 3.5–5.1)
Sodium: 136 mmol/L (ref 135–145)

## 2020-11-25 SURGERY — FIXATION, FRACTURE, INTERTROCHANTERIC, WITH INTRAMEDULLARY ROD
Anesthesia: Spinal | Site: Hip | Laterality: Left

## 2020-11-25 MED ORDER — MORPHINE SULFATE (PF) 2 MG/ML IV SOLN
0.5000 mg | INTRAVENOUS | Status: DC | PRN
  Administered 2020-11-28 (×2): 1 mg via INTRAVENOUS
  Filled 2020-11-25 (×3): qty 1

## 2020-11-25 MED ORDER — TRAMADOL HCL 50 MG PO TABS
50.0000 mg | ORAL_TABLET | Freq: Four times a day (QID) | ORAL | Status: DC
  Administered 2020-11-25 – 2020-11-28 (×11): 50 mg via ORAL
  Filled 2020-11-25 (×11): qty 1

## 2020-11-25 MED ORDER — LACTATED RINGERS IV SOLN: INTRAVENOUS | Status: DC

## 2020-11-25 MED ORDER — CHLORHEXIDINE GLUCONATE CLOTH 2 % EX PADS
6.0000 | MEDICATED_PAD | Freq: Every day | CUTANEOUS | Status: DC
  Administered 2020-11-25 – 2020-11-28 (×4): 6 via TOPICAL

## 2020-11-25 MED ORDER — BUPIVACAINE LIPOSOME 1.3 % IJ SUSP
INTRAMUSCULAR | Status: DC | PRN
  Administered 2020-11-25: 20 mL

## 2020-11-25 MED ORDER — METHOCARBAMOL 500 MG PO TABS
500.0000 mg | ORAL_TABLET | Freq: Four times a day (QID) | ORAL | Status: DC | PRN
  Administered 2020-11-26 – 2020-11-28 (×4): 500 mg via ORAL
  Filled 2020-11-25 (×4): qty 1

## 2020-11-25 MED ORDER — KETOROLAC TROMETHAMINE 30 MG/ML IJ SOLN: 30.0000 mg | Freq: Once | INTRAMUSCULAR | Status: DC | PRN

## 2020-11-25 MED ORDER — FENTANYL CITRATE (PF) 100 MCG/2ML IJ SOLN
INTRAMUSCULAR | Status: DC | PRN
  Administered 2020-11-25: 50 ug via INTRAVENOUS

## 2020-11-25 MED ORDER — SODIUM CHLORIDE 0.9 % IR SOLN
Status: DC | PRN
  Administered 2020-11-25: 1000 mL

## 2020-11-25 MED ORDER — DOCUSATE SODIUM 100 MG PO CAPS
100.0000 mg | ORAL_CAPSULE | Freq: Two times a day (BID) | ORAL | Status: DC
  Administered 2020-11-25 – 2020-11-28 (×5): 100 mg via ORAL
  Filled 2020-11-25 (×6): qty 1

## 2020-11-25 MED ORDER — MENTHOL 3 MG MT LOZG: 1.0000 | LOZENGE | OROMUCOSAL | Status: DC | PRN

## 2020-11-25 MED ORDER — MEPERIDINE HCL 50 MG/ML IJ SOLN: 6.2500 mg | INTRAMUSCULAR | Status: DC | PRN

## 2020-11-25 MED ORDER — TRANEXAMIC ACID-NACL 1000-0.7 MG/100ML-% IV SOLN
1000.0000 mg | Freq: Once | INTRAVENOUS | Status: AC
  Administered 2020-11-25: 1000 mg via INTRAVENOUS
  Filled 2020-11-25: qty 100

## 2020-11-25 MED ORDER — VANCOMYCIN HCL IN DEXTROSE 1-5 GM/200ML-% IV SOLN: 1000.0000 mg | Freq: Two times a day (BID) | INTRAVENOUS | Status: AC

## 2020-11-25 MED ORDER — BUPIVACAINE HCL (PF) 0.5 % IJ SOLN
INTRAMUSCULAR | Status: AC
  Filled 2020-11-25: qty 30

## 2020-11-25 MED ORDER — CELECOXIB 100 MG PO CAPS
200.0000 mg | ORAL_CAPSULE | Freq: Two times a day (BID) | ORAL | Status: DC
  Administered 2020-11-25 – 2020-11-28 (×6): 200 mg via ORAL
  Filled 2020-11-25 (×6): qty 2

## 2020-11-25 MED ORDER — ACETAMINOPHEN 325 MG PO TABS
325.0000 mg | ORAL_TABLET | Freq: Four times a day (QID) | ORAL | Status: DC | PRN
  Administered 2020-11-27: 650 mg via ORAL
  Filled 2020-11-25: qty 2

## 2020-11-25 MED ORDER — SODIUM CHLORIDE 0.9 % IV SOLN: INTRAVENOUS | Status: DC

## 2020-11-25 MED ORDER — METHOCARBAMOL 1000 MG/10ML IJ SOLN: 500.0000 mg | Freq: Four times a day (QID) | INTRAVENOUS | Status: DC | PRN

## 2020-11-25 MED ORDER — ONDANSETRON HCL 4 MG/2ML IJ SOLN: 4.0000 mg | Freq: Once | INTRAMUSCULAR | Status: DC | PRN

## 2020-11-25 MED ORDER — MIDAZOLAM HCL 2 MG/2ML IJ SOLN
INTRAMUSCULAR | Status: AC
  Filled 2020-11-25: qty 2

## 2020-11-25 MED ORDER — EPHEDRINE SULFATE 50 MG/ML IJ SOLN
INTRAMUSCULAR | Status: DC | PRN
  Administered 2020-11-25: 5 mg via INTRAVENOUS

## 2020-11-25 MED ORDER — BUPIVACAINE HCL (PF) 0.5 % IJ SOLN
INTRAMUSCULAR | Status: DC | PRN
  Administered 2020-11-25: 2.5 mL

## 2020-11-25 MED ORDER — CHLORHEXIDINE GLUCONATE 0.12 % MT SOLN
OROMUCOSAL | Status: AC
  Filled 2020-11-25: qty 15

## 2020-11-25 MED ORDER — ONDANSETRON HCL 4 MG/2ML IJ SOLN
INTRAMUSCULAR | Status: AC
  Filled 2020-11-25: qty 2

## 2020-11-25 MED ORDER — METOCLOPRAMIDE HCL 10 MG PO TABS: 5.0000 mg | ORAL_TABLET | Freq: Three times a day (TID) | ORAL | Status: DC | PRN

## 2020-11-25 MED ORDER — SENNOSIDES-DOCUSATE SODIUM 8.6-50 MG PO TABS: 1.0000 | ORAL_TABLET | Freq: Every evening | ORAL | Status: DC | PRN

## 2020-11-25 MED ORDER — PROPOFOL 10 MG/ML IV BOLUS
INTRAVENOUS | Status: DC | PRN
  Administered 2020-11-25: 35 ug/kg/min via INTRAVENOUS
  Administered 2020-11-25: 40 mg via INTRAVENOUS
  Administered 2020-11-25: 30 mg via INTRAVENOUS

## 2020-11-25 MED ORDER — EPHEDRINE 5 MG/ML INJ
INTRAVENOUS | Status: AC
  Filled 2020-11-25: qty 5

## 2020-11-25 MED ORDER — PHENOL 1.4 % MT LIQD: 1.0000 | OROMUCOSAL | Status: DC | PRN

## 2020-11-25 MED ORDER — DEXAMETHASONE SODIUM PHOSPHATE 10 MG/ML IJ SOLN
INTRAMUSCULAR | Status: DC | PRN
  Administered 2020-11-25: 8 mg via INTRAVENOUS

## 2020-11-25 MED ORDER — ACETAMINOPHEN 500 MG PO TABS
500.0000 mg | ORAL_TABLET | Freq: Four times a day (QID) | ORAL | Status: AC
  Administered 2020-11-25 – 2020-11-26 (×4): 500 mg via ORAL
  Filled 2020-11-25 (×4): qty 1

## 2020-11-25 MED ORDER — ONDANSETRON HCL 4 MG/2ML IJ SOLN
INTRAMUSCULAR | Status: DC | PRN
  Administered 2020-11-25: 4 mg via INTRAVENOUS

## 2020-11-25 MED ORDER — MIDAZOLAM HCL 5 MG/5ML IJ SOLN
INTRAMUSCULAR | Status: DC | PRN
  Administered 2020-11-25: 2 mg via INTRAVENOUS

## 2020-11-25 MED ORDER — METOCLOPRAMIDE HCL 5 MG/ML IJ SOLN: 5.0000 mg | Freq: Three times a day (TID) | INTRAMUSCULAR | Status: DC | PRN

## 2020-11-25 MED ORDER — ONDANSETRON HCL 4 MG PO TABS: 4.0000 mg | ORAL_TABLET | Freq: Four times a day (QID) | ORAL | Status: DC | PRN

## 2020-11-25 MED ORDER — PROPOFOL 10 MG/ML IV BOLUS
INTRAVENOUS | Status: AC
  Filled 2020-11-25: qty 20

## 2020-11-25 MED ORDER — HYDROCODONE-ACETAMINOPHEN 7.5-325 MG PO TABS
1.0000 | ORAL_TABLET | ORAL | Status: DC | PRN
  Administered 2020-11-26 – 2020-11-27 (×3): 2 via ORAL
  Filled 2020-11-25 (×4): qty 2

## 2020-11-25 MED ORDER — DEXAMETHASONE SODIUM PHOSPHATE 10 MG/ML IJ SOLN
INTRAMUSCULAR | Status: AC
  Filled 2020-11-25: qty 1

## 2020-11-25 MED ORDER — HYDROMORPHONE HCL 1 MG/ML IJ SOLN: 0.2500 mg | INTRAMUSCULAR | Status: DC | PRN

## 2020-11-25 MED ORDER — ONDANSETRON HCL 4 MG/2ML IJ SOLN: 4.0000 mg | Freq: Four times a day (QID) | INTRAMUSCULAR | Status: DC | PRN

## 2020-11-25 MED ORDER — ASPIRIN EC 325 MG PO TBEC
325.0000 mg | DELAYED_RELEASE_TABLET | Freq: Every day | ORAL | Status: DC
  Administered 2020-11-26 – 2020-11-28 (×3): 325 mg via ORAL
  Filled 2020-11-25 (×3): qty 1

## 2020-11-25 MED ORDER — HYDROCODONE-ACETAMINOPHEN 5-325 MG PO TABS
1.0000 | ORAL_TABLET | ORAL | Status: DC | PRN
  Administered 2020-11-27: 1 via ORAL
  Filled 2020-11-25: qty 2

## 2020-11-25 MED ORDER — HYDROCODONE-ACETAMINOPHEN 7.5-325 MG PO TABS: 1.0000 | ORAL_TABLET | Freq: Once | ORAL | Status: DC | PRN

## 2020-11-25 MED ORDER — BUPIVACAINE LIPOSOME 1.3 % IJ SUSP
INTRAMUSCULAR | Status: AC
  Filled 2020-11-25: qty 20

## 2020-11-25 MED ORDER — FENTANYL CITRATE (PF) 100 MCG/2ML IJ SOLN
INTRAMUSCULAR | Status: AC
  Filled 2020-11-25: qty 2

## 2020-11-25 SURGICAL SUPPLY — 61 items
BENZOIN TINCTURE PRP APPL 2/3 (GAUZE/BANDAGES/DRESSINGS) ×1 IMPLANT
BIT DRILL AO GAMMA 4.2X300 (BIT) ×2 IMPLANT
BLADE SURG SZ10 CARB STEEL (BLADE) ×4 IMPLANT
BNDG GAUZE ELAST 4 BULKY (GAUZE/BANDAGES/DRESSINGS) ×3 IMPLANT
CHLORAPREP W/TINT 26 (MISCELLANEOUS) ×2 IMPLANT
CLOTH BEACON ORANGE TIMEOUT ST (SAFETY) ×2 IMPLANT
CLSR STERI-STRIP ANTIMIC 1/2X4 (GAUZE/BANDAGES/DRESSINGS) ×1 IMPLANT
COVER LIGHT HANDLE STERIS (MISCELLANEOUS) ×4 IMPLANT
COVER MAYO STAND XLG (MISCELLANEOUS) ×2 IMPLANT
COVER PERINEAL POST (MISCELLANEOUS) ×2 IMPLANT
DRAPE STERI IOBAN 125X83 (DRAPES) ×2 IMPLANT
DRSG MEPILEX BORDER 4X12 (GAUZE/BANDAGES/DRESSINGS) ×2 IMPLANT
DRSG MEPILEX SACRM 8.7X9.8 (GAUZE/BANDAGES/DRESSINGS) ×2 IMPLANT
DRSG PAD ABDOMINAL 8X10 ST (GAUZE/BANDAGES/DRESSINGS) ×3 IMPLANT
ELECT REM PT RETURN 9FT ADLT (ELECTROSURGICAL) ×2
ELECTRODE REM PT RTRN 9FT ADLT (ELECTROSURGICAL) ×1 IMPLANT
GLOVE SKINSENSE NS SZ8.0 LF (GLOVE) ×1
GLOVE SKINSENSE STRL SZ8.0 LF (GLOVE) ×1 IMPLANT
GLOVE SS N UNI LF 8.5 STRL (GLOVE) ×2 IMPLANT
GLOVE SURG UNDER POLY LF SZ7 (GLOVE) ×4 IMPLANT
GOWN STRL REUS W/TWL LRG LVL3 (GOWN DISPOSABLE) ×2 IMPLANT
GOWN STRL REUS W/TWL XL LVL3 (GOWN DISPOSABLE) ×2 IMPLANT
GUIDEROD T2 3X1000 (ROD) ×2 IMPLANT
INST SET MAJOR BONE (KITS) ×2 IMPLANT
K-WIRE  3.2X450M STR (WIRE) ×2
K-WIRE 3.2X450M STR (WIRE) ×1
KIT BLADEGUARD II DBL (SET/KITS/TRAYS/PACK) ×2 IMPLANT
KIT TURNOVER CYSTO (KITS) ×2 IMPLANT
KWIRE 3.2X450M STR (WIRE) ×1 IMPLANT
MANIFOLD NEPTUNE II (INSTRUMENTS) ×2 IMPLANT
MARKER SKIN DUAL TIP RULER LAB (MISCELLANEOUS) ×2 IMPLANT
NAIL TROCH GAMMA 11X18 (Nail) ×1 IMPLANT
NDL HYPO 18GX1.5 BLUNT FILL (NEEDLE) IMPLANT
NDL HYPO 21X1.5 SAFETY (NEEDLE) ×1 IMPLANT
NDL SPNL 18GX3.5 QUINCKE PK (NEEDLE) ×1 IMPLANT
NEEDLE HYPO 18GX1.5 BLUNT FILL (NEEDLE) ×2 IMPLANT
NEEDLE HYPO 21X1.5 SAFETY (NEEDLE) ×2 IMPLANT
NEEDLE SPNL 18GX3.5 QUINCKE PK (NEEDLE) ×2 IMPLANT
NS IRRIG 1000ML POUR BTL (IV SOLUTION) ×2 IMPLANT
PACK BASIC III (CUSTOM PROCEDURE TRAY) ×2
PACK SRG BSC III STRL LF ECLPS (CUSTOM PROCEDURE TRAY) ×1 IMPLANT
PAD ARMBOARD 7.5X6 YLW CONV (MISCELLANEOUS) ×2 IMPLANT
PENCIL SMOKE EVACUATOR COATED (MISCELLANEOUS) ×2 IMPLANT
REAMER SHAFT BIXCUT (INSTRUMENTS) ×1 IMPLANT
SCREW LAG GAMMA 3 95MM (Screw) ×1 IMPLANT
SCREW LOCKING T2 F/T  5X37.5MM (Screw) ×2 IMPLANT
SCREW LOCKING T2 F/T 5X37.5MM (Screw) IMPLANT
SET BASIN LINEN APH (SET/KITS/TRAYS/PACK) ×2 IMPLANT
SPONGE T-LAP 18X18 ~~LOC~~+RFID (SPONGE) ×4 IMPLANT
STAPLER VISISTAT 35W (STAPLE) IMPLANT
STRIP CLOSURE SKIN 1/2X4 (GAUZE/BANDAGES/DRESSINGS) ×1 IMPLANT
SUT BRALON NAB BRD #1 30IN (SUTURE) ×2 IMPLANT
SUT MNCRL 0 VIOLET CTX 36 (SUTURE) ×1 IMPLANT
SUT MON AB 0 CT1 (SUTURE) ×1 IMPLANT
SUT MON AB 2-0 CT1 36 (SUTURE) ×2 IMPLANT
SUT MONOCRYL 0 CTX 36 (SUTURE) ×2
SYR 20ML LL LF (SYRINGE) ×2 IMPLANT
SYR 30ML LL (SYRINGE) ×2 IMPLANT
SYR BULB IRRIG 60ML STRL (SYRINGE) ×4 IMPLANT
TRAY FOLEY MTR SLVR 16FR STAT (SET/KITS/TRAYS/PACK) ×2 IMPLANT
YANKAUER SUCT BULB TIP NO VENT (SUCTIONS) ×2 IMPLANT

## 2020-11-25 NOTE — Interval H&P Note (Signed)
History and Physical Interval Note:  11/25/2020 12:39 PM  Caleb Blake  has presented today for surgery, with the diagnosis of left hip fracture.  The various methods of treatment have been discussed with the patient and family. After consideration of risks, benefits and other options for treatment, the patient has consented to  Procedure(s): INTRAMEDULLARY (IM) NAIL INTERTROCHANTRIC (Left) as a surgical intervention.  The patient's history has been reviewed, patient examined, no change in status, stable for surgery.  I have reviewed the patient's chart and labs.  Questions were answered to the patient's satisfaction.     Fuller [place]

## 2020-11-25 NOTE — Op Note (Addendum)
Open treatment internal fixation of the hip with CEPHALOMEDULLARY nail  Implant gamma St[employer]ker  Size 125 degrees short nail 95 mm lag screw 37.5 distal locking screw and a set screw and sliding position  11/25/2020 2:55 PM   Preop diagnosis 2 part left hip fracture intertrochanteric  Postoperative diagnosis same Procedure open treatment internal fixation of the left hip  Surgeon Romeo Apple Assistants none Anesthesia spinal Surgical findings 2 part stable INTERTROCHANTERIC HIP FRACTURE   BLOOD ADMINISTERED:none  DRAINS: none   LOCAL MEDICATIONS USED: 20 cc exparel   SPECIMEN:  No Specimen  DISPOSITION OF SPECIMEN:  N/A  COUNTS:  CORRECT   DICTATION: .Dragon Dictation   Surgeon Romeo Apple  The patient was taken to the recovery room in stable condition  PLAN OF CARE: Discharge to home after PACU  PATIENT DISPOSITION:  PACU - hemodynamically stable.   Delay start of Pharmacological VTE agent (>24hrs) due to surgical blood loss or risk of bleeding: Yes    The surgery was done as follows: The patient was identified in the preop area the surgical site (the left hip) was confirmed and marked after thorough chart review including radiographs; implants were checked personally.  The patient was brought back to the operating room for anesthesia and then was placed on the fracture table. The operative leg was placed in traction the nonoperative leg was padded and placed in a boot and abducted  The C-arm was brought in and a closed reduction was performed with the C-arm.  Multiplane x-rays were taken and the leg was manipulated until a stable reduction was obtained using traction to obtain proper neck shaft angle and then rotation to correct rotational malalignment  The left leg was prepped and draped with ChloraPrep. This was followed by timeout which confirmed as surgical site, implants, x-ray gowns and badges.  The incision was made over the left hip  at the greater trochanter  proximally, subcutaneous tissue was divided down to the fascial layer which was split in line with the skin and incision.  This was followed by blunt dissection with a finger down to the tip of the trochanter  The sharp tipped awl was passed down to the level lesser TROCHANTER  followed by insertion of a guidewire down to the knee.  X-ray confirmed position of the guidewire and proximal reaming was performed down to the level of the lesser trochanter.  We then passed a  125 degree nail  A second incision was made distal to the initial incision and the subcutaneous tissue was divided, muscle and fascia were split down to bone   After correction for rotation (a perforating drill was used to breech the cortex of the lateral femur followed by) insertion of a threaded guidewire which was placed in the center of the femoral head on AP and lateral x-ray  We measured the pin distance at 90 and then set the reamer to 95 mm followed by reaming over the guidewire.  The lag screw was then placed over the guidewire and the guidewire was removed.  (After irrigation of the proximal portion of the nail the setscrew was locked and then backed off one quarter turn.  This was followed by confirming engagement of the setscrew by toggling the screwdriver connected to the lag screw.)  We next use the external guide to place a third incision and then passed a cannula with a sharp tip followed by drilling with a 4.2 drill bit and a distal locking screw which measured 37.5  Final images  confirmed reduction of the fracture and hardware position.  The wound was irrigated with copious amounts of saline and closed in layered fashion starting with #1 Braylon 0 Monocryl 2-0 Monocryl and benzoin and Steri-Strips  We used 20 cc of exparel in the soft tissues  A sterile bandage was applied  Postoperative plan Weightbearing as tolerated Anticoagulation for 28 days Follow-up visit at 4 weeks for x-rays and then x-rays 12  weeks [id number]  SURGEON:  Surgeon(s) and Role:    Vickki Hearing, MD - Primary

## 2020-11-25 NOTE — Anesthesia Preprocedure Evaluation (Signed)
Anesthesia Evaluation  Patient identified by MRN, date of birth, ID band Patient awake    Reviewed: Allergy & Precautions, NPO status , Patient's Chart, lab work & pertinent test results  Airway Mallampati: II  TM Distance: >3 FB Neck ROM: Full  Mouth opening: Limited Mouth Opening  Dental no notable dental hx.    Pulmonary neg pulmonary ROS, former smoker,    Pulmonary exam normal breath sounds clear to auscultation       Cardiovascular negative cardio ROS Normal cardiovascular exam Rhythm:Regular Rate:Normal     Neuro/Psych  Neuromuscular disease negative psych ROS   GI/Hepatic negative GI ROS, Neg liver ROS,   Endo/Other  negative endocrine ROS  Renal/GU negative Renal ROS  negative genitourinary   Musculoskeletal negative musculoskeletal ROS (+)   Abdominal   Peds negative pediatric ROS (+)  Hematology negative hematology ROS (+)   Anesthesia Other Findings   Reproductive/Obstetrics negative OB ROS                             Anesthesia Physical Anesthesia Plan  ASA: 2  Anesthesia Plan: Spinal   Post-op Pain Management:    Induction: Intravenous  PONV Risk Score and Plan:   Airway Management Planned: Nasal Cannula and Natural Airway  Additional Equipment:   Intra-op Plan:   Post-operative Plan:   Informed Consent: I have reviewed the patients History and Physical, chart, labs and discussed the procedure including the risks, benefits and alternatives for the proposed anesthesia with the patient or authorized representative who has indicated his/her understanding and acceptance.     Dental advisory given  Plan Discussed with: CRNA  Anesthesia Plan Comments:         Anesthesia Quick Evaluation

## 2020-11-25 NOTE — Progress Notes (Signed)
[facility] PROGRESS NOTE   Caleb Blake YIF:[id number] DOB: May 13, 1965 DOA: 11/23/2020  PCP: Patient, No Pcp Per (Inactive)  Brief History/Interval Summary: 55 y.o. male with medical history significant for tobacco abuse, mechanical fall in 2021 from intake while reaching and trying to paint, with subsequent multiple rib fractures, right hemothorax.  Presented after fall from a 2 step ladder.  He fell on his left side.  Evaluation revealed left intertrochanteric fracture.  Patient was hospitalized for further management.  Consultants: Orthopedics  Procedures: None yet  Antibiotics: Anti-infectives (From admission, onward)    Start     Dose/Rate Route Frequency Ordered Stop   11/25/20 0600  vancomycin (VANCOCIN) IVPB 1000 mg/200 mL premix        1,000 mg 200 mL/hr over 60 Minutes Intravenous On call to O.R. 11/24/20 1800 11/26/20 0559       Subjective/Interval History: Patient mentions that he had a lot of pain and discomfort in left hip overnight.  Pain is reasonably well controlled currently. Denies any chest pain or shortness of breath.      Assessment/Plan:  Left hip fracture secondary to mechanical fall Seen by orthopedics.  Plan is for surgical intervention today.  Please review yesterday's note for preoperative medical assessment.  Continue current pain medications.  DVT prophylaxis to be initiated once cleared to do so after surgery.  Leukocytosis Most likely reactive.  No evidence for infection.  He is afebrile.  WBC is noted to be better today.  Tobacco abuse Counseling was provided.  He is not ready to quit.   DVT Prophylaxis: SCDs for now Code Status: Full code Family Communication: Discussed with the patient.  No family at bedside Disposition Plan: To be determined  Status is: Inpatient  Remains inpatient appropriate because:Ongoing active pain requiring inpatient pain management and Inpatient level of care appropriate due to severity of  illness  Dispo: The patient is from: Home              Anticipated d/c is to:  To be determined              Patient currently is not medically stable to d/c.   Difficult to place patient No         Medications: Scheduled:  chlorhexidine  60 mL Topical Once   nicotine  14 mg Transdermal Daily   povidone-iodine  2 application Topical Once   Continuous:  vancomycin     OMV:[id number] **OR** acetaminophen, HYDROmorphone (DILAUDID) injection, ondansetron **OR** ondansetron (ZOFRAN) IV, polyethylene glycol   Objective:  Vital Signs  Vitals:   11/24/20 1209 11/24/20 1702 11/24/20 2136 11/25/20 0452  BP: 123/89 (!) 145/90 (!) 147/91 117/73  Pulse: 61 66 72 65  Resp: 16 16 19 19   Temp: 98.8 F (37.1 C) 98.6 F (37 C) 99.6 F (37.6 C) 98.6 F (37 C)  TempSrc:   Oral Oral  SpO2: 97% 99% 98% 97%  Weight:      Height:        Intake/Output Summary (Last 24 hours) at 11/25/2020 1021 Last data filed at 11/25/2020 0900 Gross per 24 hour  Intake 720 ml  Output 1400 ml  Net -680 ml   Filed Weights   11/23/20 1621  Weight: 65.8 kg    General appearance: Awake alert.  In no distress Resp: Clear to auscultation bilaterally.  Normal effort Cardio: S1-S2 is normal regular.  No S3-S4.  No rubs murmurs or bruit GI: Abdomen is soft.  Nontender nondistended.  Bowel sounds are present normal.  No masses organomegaly Extremities: No edema.  Left lower extremity is externally rotated. Neurologic: Alert and oriented x3.  No focal neurological deficits.     Lab Results:  Data Reviewed: I have personally reviewed following labs and imaging studies  CBC: Recent Labs  Lab 11/23/20 1640 11/24/20 0859 11/25/20 0330  WBC 17.4* 14.0* 12.4*  NEUTROABS 15.2*  --   --   HGB 14.8 14.2 13.5  HCT 43.8 42.4 40.3  MCV 96.5 97.7 96.9  PLT 213 186 179     Basic Metabolic Panel: Recent Labs  Lab 11/23/20 1640 11/24/20 0859 11/25/20 0330  NA 137 136 136  K 3.8 3.9 3.9  CL 107  107 103  CO2 26 24 24   GLUCOSE 75 102* 96  BUN 15 19 17   CREATININE 0.79 0.75 0.72  CALCIUM 9.0 8.5* 8.6*     GFR: Estimated Creatinine Clearance: 93 mL/min (by C-G formula based on SCr of 0.72 mg/dL).  Liver Function Tests: Recent Labs  Lab 11/23/20 1640  AST 18  ALT 14  ALKPHOS 63  BILITOT 0.8  PROT 7.1  ALBUMIN 4.3       Recent Results (from the past 240 hour(s))  Resp Panel by RT-PCR (Flu A&B, Covid) Nasopharyngeal Swab     Status: None   Collection Time: 11/23/20  5:06 PM   Specimen: Nasopharyngeal Swab; Nasopharyngeal(NP) swabs in vial transport medium  Result Value Ref Range Status   SARS Coronavirus 2 by RT PCR NEGATIVE NEGATIVE Final    Comment: (NOTE) SARS-CoV-2 target nucleic acids are NOT DETECTED.  The SARS-CoV-2 RNA is generally detectable in upper respiratory specimens during the acute phase of infection. The lowest concentration of SARS-CoV-2 viral copies this assay can detect is 138 copies/mL. A negative result does not preclude SARS-Cov-2 infection and should not be used as the sole basis for treatment or other patient management decisions. A negative result may occur with  improper specimen collection/handling, submission of specimen other than nasopharyngeal swab, presence of viral mutation(s) within the areas targeted by this assay, and inadequate number of viral copies(<138 copies/mL). A negative result must be combined with clinical observations, patient history, and epidemiological information. The expected result is Negative.  Fact Sheet for Patients:  01/23/21  Fact Sheet for Healthcare Providers:  01/23/21  This test is no t yet approved or cleared by the [employer] FDA and  has been authorized for detection and/or diagnosis of SARS-CoV-2 by FDA under an Emergency Use Authorization (EUA). This EUA will remain  in effect (meaning this test can be used) for the  duration of the COVID-19 declaration under Section 564(b)(1) of the Act, 21 U.S.C.section 360bbb-3(b)(1), unless the authorization is terminated  or revoked sooner.       Influenza A by PCR NEGATIVE NEGATIVE Final   Influenza B by PCR NEGATIVE NEGATIVE Final    Comment: (NOTE) The Xpert Xpress SARS-CoV-2/FLU/RSV plus assay is intended as an aid in the diagnosis of influenza from Nasopharyngeal swab specimens and should not be used as a sole basis for treatment. Nasal washings and aspirates are unacceptable for Xpert Xpress SARS-CoV-2/FLU/RSV testing.  Fact Sheet for Patients: [employer]it  Fact Sheet for Healthcare Providers: [place]  This test is not yet approved or cleared by the [employer] FDA and has been authorized for detection and/or diagnosis of SARS-CoV-2 by FDA under an Emergency Use Authorization (EUA). This EUA will remain in effect (meaning this test can be used) for the  duration of the COVID-19 declaration under Section 564(b)(1) of the Act, 21 U.S.C. section 360bbb-3(b)(1), unless the authorization is terminated or revoked.  Performed at [facility], [address], [place], [place] [address]   Surgical pcr screen     Status: None   Collection Time: 11/24/20  6:57 PM   Specimen: Nasal Mucosa; Nasal Swab  Result Value Ref Range Status   MRSA, PCR NEGATIVE NEGATIVE Final   Staphylococcus aureus NEGATIVE NEGATIVE Final    Comment: (NOTE) The Xpert SA Assay (FDA approved for NASAL specimens in patients 42 years of age and older), is one component of a comprehensive surveillance program. It is not intended to diagnose infection nor to guide or monitor treatment. Performed at [facility], [address] [place], [place] [address]        Radiology Studies: CT HEAD WO CONTRAST ( )  Result Date: 11/23/2020 CLINICAL DATA:  Head trauma, abnormal mental status (Age 69-64y); Neck trauma,  dangerous injury mechanism (Age 31-64y) EXAM: CT HEAD WITHOUT CONTRAST CT CERVICAL SPINE WITHOUT CONTRAST TECHNIQUE: Multidetector CT imaging of the head and cervical spine was performed following the standard protocol without intravenous contrast. Multiplanar CT image reconstructions of the cervical spine were also generated. COMPARISON:  None. FINDINGS: CT HEAD FINDINGS Brain: No evidence of acute large vascular territory infarction, hemorrhage, hydrocephalus, extra-axial collection or mass lesion/mass effect. Mild atrophy. Vascular: No hyperdense vessel identified. Mild calcific atherosclerosis. Skull: No evidence of acute fracture. Sinuses/Orbits: Mild paranasal sinus mucosal thickening without air-fluid levels. No acute orbital findings. Other: No mastoid effusions. CT CERVICAL SPINE FINDINGS Alignment: No substantial sagittal subluxation. Mild levocurvature in the cervical spine and dextrocurvature of the cervicothoracic junction. Rotation of C1 on C2. 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 Soft tissues and spinal canal: No prevertebral fluid or swelling. No visible canal hematoma. Disc levels:  Mild-to-moderate multilevel degenerative disc disease. Upper chest: Visualized lung apices are clear. IMPRESSION: CT head: No evidence of acute intracranial abnormality. CT cervical spine: 1. No evidence of acute fracture. 2. Rotation of C1 on C2, most likely positional in the absence of a fixed torticollis. 3. 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 Electronically Signed   By: Feliberto Harts MD   On: 11/23/2020 18:49   CT Cervical Spine Wo Contrast  Result Date: 11/23/2020 CLINICAL DATA:  Head trauma, abnormal mental status (Age 66-64y); Neck trauma, dangerous injury mechanism (Age 18-64y) EXAM: CT  HEAD WITHOUT CONTRAST CT CERVICAL SPINE WITHOUT CONTRAST TECHNIQUE: Multidetector CT imaging of the head and cervical spine was performed following the standard protocol without intravenous contrast. Multiplanar CT image reconstructions of the cervical spine were also generated. COMPARISON:  None. FINDINGS: CT HEAD FINDINGS Brain: No evidence of acute large vascular territory infarction, hemorrhage, hydrocephalus, extra-axial collection or mass lesion/mass effect. Mild atrophy. Vascular: No hyperdense vessel identified. Mild calcific atherosclerosis. Skull: No evidence of acute fracture. Sinuses/Orbits: Mild paranasal sinus mucosal thickening without air-fluid levels. No acute orbital findings. Other: No mastoid effusions. CT CERVICAL SPINE FINDINGS Alignment: No substantial sagittal subluxation. Mild levocurvature in the cervical spine and dextrocurvature of the cervicothoracic junction. Rotation of C1 on C2. 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 Soft tissues and spinal canal: No prevertebral fluid or swelling. No visible canal hematoma. Disc levels:  Mild-to-moderate multilevel degenerative disc disease. Upper chest: Visualized lung apices are clear. IMPRESSION: CT head: No evidence of acute intracranial abnormality. CT cervical spine: 1. No evidence of acute fracture. 2. Rotation of C1 on C2, most likely positional in the absence of a fixed torticollis. 3. 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 Electronically Signed   By: Feliberto HartsFrederick S Jones MD   On: 11/23/2020 18:49   DG Pelvis Portable  Result Date: 11/23/2020 CLINICAL DATA:  Patient fell off 2 step ladder.  LEFT hip pain. EXAM: PORTABLE PELVIS 1-2 VIEWS COMPARISON:  10/04/2014 FINDINGS: There is a comminuted intertrochanteric fracture of the  LEFT hip associated with minimal displacement. Remainder of the pelvis is intact. IMPRESSION: Intertrochanteric fracture of the LEFT hip. Electronically Signed   By: Norva PavlovElizabeth  Brown M.D.   On: 11/23/2020 17:41   DG Chest Port 1 View  Result Date: 11/23/2020 CLINICAL DATA:  Larey SeatFell off 2 step ladder. EXAM: PORTABLE CHEST 1 VIEW COMPARISON:  10/17/2019 FINDINGS: Heart size is normal. Lungs are clear. No pneumothorax. Numerous remote RIGHT rib fractures are present. No acute fracture. Degenerative changes are seen in the thoracolumbar spine. IMPRESSION: No evidence for acute  abnormality.  Remote RIGHT rib fractures. Electronically Signed   By: Norva PavlovElizabeth  Brown M.D.   On: 11/23/2020 17:42   DG HIP UNILAT WITH PELVIS 2-3 VIEWS LEFT  Result Date: 11/23/2020 CLINICAL DATA:  Left hip pain, fall EXAM: DG HIP (WITH OR WITHOUT PELVIS) 2-3V LEFT COMPARISON:  None. FINDINGS: There is a left femoral intertrochanteric fracture. Minimal displacement. No subluxation or dislocation. IMPRESSION: Left femoral intertrochanteric fracture. Electronically Signed   By: Charlett NoseKevin  Dover M.D.   On: 11/23/2020 19:06       LOS: 2 days   Devario Bucklew Rito EhrlichKrishnan  [facility] Pager on [email or web address]  11/25/2020, 10:21 AM

## 2020-11-25 NOTE — Brief Op Note (Signed)
11/25/2020  2:52 PM  PATIENT:  Caleb Blake  55 y.o. male  PRE-OPERATIVE DIAGNOSIS:  left hip fracture  POST-OPERATIVE DIAGNOSIS:  left hip fracture, intertrochanteric fracture  PROCEDURE:  Procedure(s): INTRAMEDULLARY (IM) NAIL INTERTROCHANTRIC (Left)  Findings 2 part intertrochanteric fracture minimal rotational malalignment no displacement or angulation  SURGEON:  Surgeon(s) and Role:    Vickki Hearing, MD - Primary  PHYSICIAN ASSISTANT:   ASSISTANTS: none   ANESTHESIA:   spinal  EBL:  50 mL   BLOOD ADMINISTERED:none  DRAINS: none   LOCAL MEDICATIONS USED:  OTHER exparel 20 cc  SPECIMEN:  No Specimen  DISPOSITION OF SPECIMEN:  N/A  COUNTS:  YES  TOURNIQUET:  * No tourniquets in log *  DICTATION: .Dragon Dictation  PLAN OF CARE: Admit to inpatient   PATIENT DISPOSITION:  PACU - hemodynamically stable.   Delay start of Pharmacological VTE agent (>24hrs) due to surgical blood loss or risk of bleeding: yes

## 2020-11-25 NOTE — Transfer of Care (Signed)
Immediate Anesthesia Transfer of Care Note  Patient: Caleb Blake  Procedure(s) Performed: INTRAMEDULLARY (IM) NAIL INTERTROCHANTRIC (Left: Hip)  Patient Location: PACU  Anesthesia Type:Spinal  Level of Consciousness: awake, alert , oriented, patient cooperative and responds to stimulation  Airway & Oxygen Therapy: Patient Spontanous Breathing  Post-op Assessment: Report given to RN and Post -op Vital signs reviewed and stable  Post vital signs: Reviewed and stable  Last Vitals:  Vitals Value Taken Time  BP 100/70 11/25/20 1452  Temp    Pulse 65 11/25/20 1452  Resp 8 11/25/20 1452  SpO2 96 % 11/25/20 1452  Vitals shown include unvalidated device data.  Last Pain:  Vitals:   11/25/20 1227  TempSrc: Oral  PainSc: 8       Patients Stated Pain Goal: 5 (11/25/20 1227)  Complications: No notable events documented.

## 2020-11-25 NOTE — Anesthesia Postprocedure Evaluation (Signed)
Anesthesia Post Note  Patient: Caleb Blake  Procedure(s) Performed: INTRAMEDULLARY (IM) NAIL INTERTROCHANTRIC (Left: Hip)  Patient location during evaluation: PACU Anesthesia Type: Spinal Level of consciousness: awake and alert Pain management: pain level controlled Vital Signs Assessment: post-procedure vital signs reviewed and stable Respiratory status: spontaneous breathing, nonlabored ventilation, respiratory function stable and patient connected to nasal cannula oxygen Cardiovascular status: stable and blood pressure returned to baseline Postop Assessment: no apparent nausea or vomiting Anesthetic complications: no   No notable events documented.   Last Vitals:  Vitals:   11/25/20 1455 11/25/20 1500  BP: 100/70 118/74  Pulse: 66 68  Resp: 12 11  Temp: 36.6 C   SpO2: 95% 93%    Last Pain:  Vitals:   11/25/20 1455  TempSrc:   PainSc: Asleep                 Shona Needles

## 2020-11-25 NOTE — Anesthesia Procedure Notes (Signed)
Spinal  Patient location during procedure: OR Start time: 11/25/2020 1:02 PM End time: 11/25/2020 1:04 PM Reason for block: surgical anesthesia Staffing Performed: resident/CRNA  Anesthesiologist: Shona Needles, MD Resident/CRNA: Brynda Peon, CRNA Preanesthetic Checklist Completed: patient identified, IV checked, site marked, risks and benefits discussed, surgical consent, monitors and equipment checked, pre-op evaluation and timeout performed Spinal Block Patient position: left lateral decubitus Prep: ChloraPrep Patient monitoring: heart rate, cardiac monitor, continuous pulse ox and blood pressure Approach: left paramedian Location: L4-5 Injection technique: single-shot Needle Needle type: Quincke  Needle gauge: 22 G Needle length: 10 cm Assessment Sensory level: T4 Events: CSF return Additional Notes Poor CSF flow, respiratory variation.

## 2020-11-26 ENCOUNTER — Encounter (HOSPITAL_COMMUNITY): Payer: Self-pay | Admitting: Orthopedic Surgery

## 2020-11-26 DIAGNOSIS — S72143A Displaced intertrochanteric fracture of unspecified femur, initial encounter for closed fracture: Secondary | ICD-10-CM

## 2020-11-26 DIAGNOSIS — W19XXXA Unspecified fall, initial encounter: Secondary | ICD-10-CM

## 2020-11-26 LAB — CBC
HCT: 37.3 % — ABNORMAL LOW (ref 39.0–52.0)
Hemoglobin: 12.4 g/dL — ABNORMAL LOW (ref 13.0–17.0)
MCH: 32.5 pg (ref 26.0–34.0)
MCHC: 33.2 g/dL (ref 30.0–36.0)
MCV: 97.9 fL (ref 80.0–100.0)
Platelets: 169 10*3/uL (ref 150–400)
RBC: 3.81 MIL/uL — ABNORMAL LOW (ref 4.22–5.81)
RDW: 12.2 % (ref 11.5–15.5)
WBC: 20.5 10*3/uL — ABNORMAL HIGH (ref 4.0–10.5)
nRBC: 0 % (ref 0.0–0.2)

## 2020-11-26 LAB — BASIC METABOLIC PANEL
Anion gap: 6 (ref 5–15)
BUN: 18 mg/dL (ref 6–20)
CO2: 25 mmol/L (ref 22–32)
Calcium: 8.7 mg/dL — ABNORMAL LOW (ref 8.9–10.3)
Chloride: 105 mmol/L (ref 98–111)
Creatinine, Ser: 0.72 mg/dL (ref 0.61–1.24)
GFR, Estimated: >60 mL/min (ref >60)
Glucose, Bld: 194 mg/dL — ABNORMAL HIGH (ref 70–99)
Potassium: 4.2 mmol/L (ref 3.5–5.1)
Sodium: 136 mmol/L (ref 135–145)

## 2020-11-26 LAB — GLUCOSE, CAPILLARY
Glucose-Capillary: 162 mg/dL — ABNORMAL HIGH (ref 70–99)
Glucose-Capillary: 171 mg/dL — ABNORMAL HIGH (ref 70–99)
Glucose-Capillary: 114 mg/dL — ABNORMAL HIGH (ref 70–99)
Glucose-Capillary: 121 mg/dL — ABNORMAL HIGH (ref 70–99)

## 2020-11-26 NOTE — Progress Notes (Signed)
Subjective: 1 Day Post-Op Procedure(s) (LRB): INTRAMEDULLARY (IM) NAIL INTERTROCHANTRIC (Left) Patient reports pain as mild.    Objective: Vital signs in last 24 hours: Temp:  [97.7 F (36.5 C)-98.5 F (36.9 C)] 97.7 F (36.5 C) (08/10 0520) Pulse Rate:  [59-79] 59 (08/10 0520) Resp:  [8-19] 19 (08/10 0520) BP: (100-127)/(61-80) 117/75 (08/10 0520) SpO2:  [93 %-99 %] 96 % (08/10 0520) Weight:  [65.8 kg] 65.8 kg (08/09 1227)  Intake/Output from previous day: 08/09 0701 - 08/10 0700 In: 3140 [P.O.:240; I.V.:1800; IV Piggyback:200] Out: 4575 [Urine:4525; Blood:50] Intake/Output this shift: No intake/output data recorded.  Recent Labs    11/23/20 1640 11/24/20 0859 11/25/20 0330 11/26/20 0538  HGB 14.8 14.2 13.5 12.4*   Recent Labs    11/25/20 0330 11/26/20 0538  WBC 12.4* 20.5*  RBC 4.16* 3.81*  HCT 40.3 37.3*  PLT 179 169   Recent Labs    11/25/20 0330 11/26/20 0538  NA 136 136  K 3.9 4.2  CL 103 105  CO2 24 25  BUN 17 18  CREATININE 0.72 0.72  GLUCOSE 96 194*  CALCIUM 8.6* 8.7*   No results for input(s): LABPT, INR in the last 72 hours.  Neurologically intact Neurovascular intact Sensation intact distally Intact pulses distally Dorsiflexion/Plantar flexion intact Incision: dressing C/D/I Compartment soft   Assessment/Plan: 1 Day Post-Op Procedure(s) (LRB): INTRAMEDULLARY (IM) NAIL INTERTROCHANTRIC (Left) Advance diet Up with therapy D/C IV fluids     Fuller Canada 11/26/2020, 7:29 AM

## 2020-11-26 NOTE — Progress Notes (Signed)
Removed Foley, pt tolerated procedure well. Emptied 1800 CC of urine before doing that. Reva Bores, 6:40 AM 11/26/20

## 2020-11-26 NOTE — Plan of Care (Signed)
  Problem: Education: Goal: Knowledge of General Education information will improve Description Including pain rating scale, medication(s)/side effects and non-pharmacologic comfort measures Outcome: Progressing   Problem: Health Behavior/Discharge Planning: Goal: Ability to manage health-related needs will improve Outcome: Progressing   

## 2020-11-26 NOTE — Plan of Care (Signed)
  Problem: Acute Rehab PT Goals(only PT should resolve) Goal: Pt Will Go Supine/Side To Sit Outcome: Progressing Flowsheets (Taken 11/26/2020 1017) Pt will go Supine/Side to Sit: with modified independence Goal: Pt Will Go Sit To Supine/Side Outcome: Progressing Flowsheets (Taken 11/26/2020 1017) Pt will go Sit to Supine/Side: with modified independence Goal: Patient Will Transfer Sit To/From Stand Outcome: Progressing Flowsheets (Taken 11/26/2020 1017) Patient will transfer sit to/from stand: with modified independence Goal: Pt Will Transfer Bed To Chair/Chair To Bed Outcome: Progressing Flowsheets (Taken 11/26/2020 1017) Pt will Transfer Bed to Chair/Chair to Bed: with modified independence Goal: Pt Will Ambulate Outcome: Progressing Flowsheets (Taken 11/26/2020 1017) Pt will Ambulate:  25 feet  with modified independence  with rolling walker Goal: Pt/caregiver will Perform Home Exercise Program Outcome: Progressing Flowsheets (Taken 11/26/2020 1017) Pt/caregiver will Perform Home Exercise Program:  For increased strengthening  For improved balance  Independently  10:17 AM, 11/26/20 Wyman Songster PT, DPT Physical Therapist at [facility]

## 2020-11-26 NOTE — Evaluation (Signed)
Physical Therapy Evaluation Patient Details Name: Caleb Blake MRN: [id number] DOB: 1966-01-01 Today's Date: 11/26/2020   History of Present Illness  Caleb Blake is a 55 y.o. male with medical history significant for tobacco abuse, mechanical fall in 2021 from intake while reaching and trying to paint, with subsequent multiple rib fractures, right hemothorax.    Patient presented to the ED again today with reports of a fall.  Patient's fell from a 2 step ladder while changing light bulbs.  Patient hit his head, and reports pain in his left leg. He denies dizziness, chest pain and difficulty breathing.  Patient reports he fell because he was holding a bulb with 1 hand, and trying to change a light bulb with the other hand, with both hands occupied, he lost his balance and fell. Fall resulted in hip fracture and ORIF on 8/9.   Clinical Impression  Patient requiring assist today due to L hip pain and weakness. Patient given cueing for sequencing with transfers and standing activities. Patient able to perform weight shifting at bedside with heavy UE support and stating about 50% weight acceptance on LLE. Patient able to ambulate 1 step forward and back with heavy UE support and unsteadiness. Patient ambulates several lateral steps at bedside but overall limited by pain. Patient unable to support self well with UE support and attempting to hop using RLE and is unsafe. Patient may be able to return home pending progress with ambulation and family support. Patient will need RW upon returning home if he does not go to rehab first. Patient will benefit from continued physical therapy in hospital and recommended venue below to increase strength, balance, endurance for safe ADLs and gait.     Follow Up Recommendations SNF;Supervision/Assistance - 24 hour;Supervision for mobility/OOB    Equipment Recommendations  Rolling walker with 5" wheels (if returning home at discharge from hospital)     Recommendations for Other Services       Precautions / Restrictions Precautions Precautions: Fall Restrictions Weight Bearing Restrictions: Yes LLE Weight Bearing: Weight bearing as tolerated      Mobility  Bed Mobility Overal bed mobility: Needs Assistance Bed Mobility: Supine to Sit;Sit to Supine     Supine to sit: Min assist;HOB elevated Sit to supine: Min assist   General bed mobility comments: assist for LLE mobility    Transfers Overall transfer level: Needs assistance Equipment used: Rolling walker (2 wheeled) Transfers: Sit to/from Stand Sit to Stand: Min assist         General transfer comment: labored transfer to standing with RW, cueing for mostly RLE use, hand placement with RW, and sequencing  Ambulation/Gait Ambulation/Gait assistance: Min assist Gait Distance (Feet): 3 Feet Assistive device: Rolling walker (2 wheeled) Gait Pattern/deviations: Step-to pattern;Antalgic Gait velocity: decreased   General Gait Details: very limited with ambulation due to L hip pain with weightbearing, limited to 1 step forward and retro and several lateral steps, heavy UE support  Stairs            Wheelchair Mobility    Modified Rankin (Stroke Patients Only)       Balance Overall balance assessment: Needs assistance Sitting-balance support: No upper extremity supported;Feet supported Sitting balance-Leahy Scale: Normal Sitting balance - Comments: seated EOB   Standing balance support: Bilateral upper extremity supported Standing balance-Leahy Scale: Poor Standing balance comment: relies on RW with UE support  Pertinent Vitals/Pain Pain Assessment: No/denies pain (at rest)    Home Living Family/patient expects to be discharged to:: Private residence Living Arrangements: Alone Available Help at Discharge: Family Type of Home: House Home Access: Ramped entrance     Home Layout: One level Home Equipment:  Gilmer Mor - single point      Prior Function Level of Independence: Independent         Comments: community ambulator     Higher education careers adviser        Extremity/Trunk Assessment   Upper Extremity Assessment Upper Extremity Assessment: Overall WFL for tasks assessed    Lower Extremity Assessment Lower Extremity Assessment: Overall WFL for tasks assessed    Cervical / Trunk Assessment Cervical / Trunk Assessment: Normal  Communication   Communication: No difficulties  Cognition Arousal/Alertness: Awake/alert Behavior During Therapy: WFL for tasks assessed/performed Overall Cognitive Status: Within Functional Limits for tasks assessed                                        General Comments      Exercises General Exercises - Lower Extremity Ankle Circles/Pumps: AROM;Both;10 reps;Supine Gluteal Sets: AROM;Both;10 reps;Supine Heel Slides: AROM;Left;5 reps;Supine   Assessment/Plan    PT Assessment Patient needs continued PT services  PT Problem List Decreased strength;Decreased mobility;Decreased activity tolerance;Pain;Decreased balance       PT Treatment Interventions DME instruction;Therapeutic exercise;Gait training;Balance training;Stair training;Neuromuscular re-education;Functional mobility training;Therapeutic activities;Patient/family education    PT Goals (Current goals can be found in the Care Plan section)  Acute Rehab PT Goals Patient Stated Goal: Return home PT Goal Formulation: With patient Time For Goal Achievement: 12/10/20 Potential to Achieve Goals: Good    Frequency Min 3X/week   Barriers to discharge        Co-evaluation               AM-PAC PT "6 Clicks" Mobility  Outcome Measure Help needed turning from your back to your side while in a flat bed without using bedrails?: A Little Help needed moving from lying on your back to sitting on the side of a flat bed without using bedrails?: A Little Help needed moving to and  from a bed to a chair (including a wheelchair)?: A Little Help needed standing up from a chair using your arms (e.g., wheelchair or bedside chair)?: A Little Help needed to walk in hospital room?: A Lot Help needed climbing 3-5 steps with a railing? : Total 6 Click Score: 15    End of Session Equipment Utilized During Treatment: Gait belt Activity Tolerance: Patient tolerated treatment well;Patient limited by pain Patient left: in bed;with call bell/phone within reach Nurse Communication: Mobility status PT Visit Diagnosis: Unsteadiness on feet (R26.81);Other abnormalities of gait and mobility (R26.89);Muscle weakness (generalized) (M62.81)    Time: 5056-9794 PT Time Calculation (min) (ACUTE ONLY): 28 min   Charges:   PT Evaluation $PT Eval Moderate Complexity: 1 Mod PT Treatments $Therapeutic Exercise: 8-22 mins $Therapeutic Activity: 8-22 mins        10:15 AM, 11/26/20 Wyman Songster PT, DPT Physical Therapist at [facility]

## 2020-11-26 NOTE — [place] FL2 (Signed)
[place] MEDICAID FL2 LEVEL OF CARE SCREENING TOOL     IDENTIFICATION  Patient Name: Caleb Blake Birthdate: Sep 04, 1965 Sex: male Admission Date (Current Location): 11/23/2020  [facility] and [place] Number:  [employer] and Address:  [facility],  [address], [place] [address]      Provider Number: [id number]  Attending Physician Name and Address:  Jerald Kief, MD  Relative Name and Phone Number:  DERK, DOUBEK [facility])   [phone]    Current Level of Care: Hospital Recommended Level of Care: Skilled Nursing Facility Prior Approval Number:    Date Approved/Denied:   PASRR Number: [id number]  Discharge Plan: SNF    Current Diagnoses: Patient Active Problem List   Diagnosis Date Noted   Closed 2-part intertrochanteric fracture of femur, initial encounter [facility])    Closed left hip fracture (HCC) 11/23/2020   Tobacco abuse 11/23/2020   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   Hemothorax, right-----??Dressler Type inflammation 09/05/2019   Multiple rib fractures 09/05/2019   Liver lesion-- Needs MRI as outpatient once breathing is better 09/05/2019   Cigarette smoker 09/05/2019   Trauma 08/15/2019    Orientation RESPIRATION BLADDER Height & Weight     Self, Time, Situation, Place  Normal Continent Weight: 145 lb (65.8 kg) Height:  5\' 6"  (167.6 cm)  BEHAVIORAL SYMPTOMS/MOOD NEUROLOGICAL BOWEL NUTRITION STATUS      Continent Diet  AMBULATORY STATUS COMMUNICATION OF NEEDS Skin   Extensive Assist Verbally                         Personal Care Assistance Level of Assistance  Bathing, Feeding, Dressing, Total care Bathing Assistance: Limited assistance Feeding assistance: Independent Dressing Assistance: Limited assistance Total Care Assistance: Limited assistance   Functional Limitations Info  Sight, Hearing, Speech Sight Info: Adequate Hearing Info: Adequate Speech Info: Adequate    SPECIAL CARE FACTORS FREQUENCY   PT (By licensed PT), OT (By licensed OT)     PT Frequency: 5 times weekly OT Frequency: 5 times weekly            Contractures Contractures Info: Not present    Additional Factors Info  Code Status, Allergies Code Status Info: FULL Allergies Info: Penicillins           Current Medications (11/26/2020):  This is the current hospital active medication list Current Facility-Administered Medications  Medication Dose Route Frequency Provider Last Rate Last Admin   acetaminophen (TYLENOL) tablet 325-650 mg  325-650 mg Oral Q6H PRN 01/26/2021, MD       aspirin EC tablet 325 mg  325 mg Oral Q breakfast Vickki Hearing, MD   325 mg at 11/26/20 01/26/21   celecoxib (CELEBREX) capsule 200 mg  200 mg Oral BID 2778, MD   200 mg at 11/26/20 01/26/21   Chlorhexidine Gluconate Cloth 2 % PADS 6 each  6 each Topical Daily 2423, MD   6 each at 11/26/20 0827   docusate sodium (COLACE) capsule 100 mg  100 mg Oral BID 01/26/21, MD   100 mg at 11/26/20 0826   HYDROcodone-acetaminophen (NORCO) 7.5-325 MG per tablet 1-2 tablet  1-2 tablet Oral Q4H PRN 08-17-1976, MD   2 tablet at 11/26/20 1157   HYDROcodone-acetaminophen (NORCO/VICODIN) 5-325 MG per tablet 1-2 tablet  1-2 tablet Oral Q4H PRN 01/26/21, MD       menthol-cetylpyridinium (CEPACOL) lozenge 3 mg  1 lozenge Oral PRN Vickki Hearing, MD       Or   phenol (CHLORASEPTIC) mouth spray 1 spray  1 spray Mouth/Throat PRN Vickki Hearing, MD       methocarbamol (ROBAXIN) tablet 500 mg  500 mg Oral Q6H PRN Vickki Hearing, MD       Or   methocarbamol (ROBAXIN) 500 mg in dextrose 5 % 50 mL IVPB  500 mg Intravenous Q6H PRN Vickki Hearing, MD       metoCLOPramide (REGLAN) tablet 5-10 mg  5-10 mg Oral Q8H PRN Vickki Hearing, MD       Or   metoCLOPramide (REGLAN) injection 5-10 mg  5-10 mg Intravenous Q8H PRN Vickki Hearing, MD       morphine 2 MG/ML injection  0.5-1 mg  0.5-1 mg Intravenous Q2H PRN Vickki Hearing, MD       ondansetron [facility]) tablet 4 mg  4 mg Oral Q6H PRN Vickki Hearing, MD       Or   ondansetron [facility]) injection 4 mg  4 mg Intravenous Q6H PRN Vickki Hearing, MD       senna-docusate (Senokot-S) tablet 1 tablet  1 tablet Oral QHS PRN Vickki Hearing, MD       traMADol Janean Sark) tablet 50 mg  50 mg Oral Q6H Vickki Hearing, MD   50 mg at 11/26/20 1157     Discharge Medications: Please see discharge summary for a list of discharge medications.  Relevant Imaging Results:  Relevant Lab Results:   Additional Information SSN: [id number], [place]

## 2020-11-26 NOTE — TOC Initial Note (Signed)
Transition of Care [facility]) - Initial/Assessment Note    Patient Details  Name: Caleb Blake MRN: [id number] Date of Birth: 1965/10/08  Transition of Care [facility]) CM/SW Contact:    Villa Herb, LCSWA Phone Number: 11/26/2020, 2:06 PM  Clinical Narrative:                 CSW spoke with pt in room about PT recommending SNF at discharge. Pt states that he is okay with CSW starting referral and looking for bed offers however he would like to talk with his brother this evening about things. CSW will meet with pt in the AM to speak about final decision. CSW completed pts PASRR and Fl2. Referral to be sent out for possible bed offers. Pt may decide on SNF but also may decide to go home with brother and [facility] services. TOC to follow.   Expected Discharge Plan: Skilled Nursing Facility Barriers to Discharge: Continued Medical Work up   Patient Goals and CMS Choice Patient states their goals for this hospitalization and ongoing recovery are:: Rehab or home CMS Medicare.gov Compare Post Acute Care list provided to:: Patient Choice offered to / list presented to : Patient  Expected Discharge Plan and Services Expected Discharge Plan: Skilled Nursing Facility In-house Referral: Clinical Social Work Discharge Planning Services: CM Consult Post Acute Care Choice: Skilled Nursing Facility Living arrangements for the past 2 months: Single Family Home                                      Prior Living Arrangements/Services Living arrangements for the past 2 months: Single Family Home Lives with:: Self Patient language and need for interpreter reviewed:: Yes Do you feel safe going back to the place where you live?: Yes      Need for Family Participation in Patient Care: No (Comment) Care giver support system in place?: Yes (comment)   Criminal Activity/Legal Involvement Pertinent to Current Situation/Hospitalization: No - Comment as needed  Activities of Daily Living Home Assistive  Devices/Equipment: None ADL Screening (condition at time of admission) Patient's cognitive ability adequate to safely complete daily activities?: Yes Is the patient deaf or have difficulty hearing?: No Does the patient have difficulty seeing, even when wearing glasses/contacts?: No Does the patient have difficulty concentrating, remembering, or making decisions?: No Patient able to express need for assistance with ADLs?: Yes Does the patient have difficulty dressing or bathing?: No Independently performs ADLs?: Yes (appropriate for developmental age) Does the patient have difficulty walking or climbing stairs?: No Weakness of Legs: Both Weakness of Arms/Hands: Both  Permission Sought/Granted                  Emotional Assessment Appearance:: Appears stated age Attitude/Demeanor/Rapport: Engaged Affect (typically observed): Accepting Orientation: : Oriented to Self, Oriented to Place, Oriented to  Time, Oriented to Situation Alcohol / Substance Use: Not Applicable Psych Involvement: No (comment)  Admission diagnosis:  Fall [W19.XXXA] Closed left hip fracture (HCC) [S72.002A] Fall, initial encounter [W19.XXXA] Patient Active Problem List   Diagnosis Date Noted   Closed 2-part intertrochanteric fracture of femur, initial encounter (HCC)    Closed left hip fracture (HCC) 11/23/2020   Tobacco abuse 11/23/2020   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 [place] [address] Phone: [phone] Fax: [phone]     Social Determinants of Health (SDOH) Interventions     Readmission Risk Interventions Readmission Risk Prevention Plan 11/26/2020  Medication Screening Complete  Transportation Screening Complete  Some recent data might be hidden

## 2020-11-26 NOTE — Progress Notes (Addendum)
PROGRESS NOTE    RADFORD PEASE  BTD:[id number] DOB: 1966/01/11 DOA: 11/23/2020 PCP: Patient, No Pcp Per (Inactive)    Brief Narrative:  55 y.o. male with medical history significant for tobacco abuse, mechanical fall in 2021 from intake while reaching and trying to paint, with subsequent multiple rib fractures, right hemothorax.  Presented after fall from a 2 step ladder.  He fell on his left side.  Evaluation revealed left intertrochanteric fracture.  Patient was hospitalized for further management.  Assessment & Plan:   Principal Problem:   Closed left hip fracture (HCC) Active Problems:   Cigarette smoker   Closed 2-part intertrochanteric fracture of femur, initial encounter (HCC)  Left hip fracture secondary to mechanical fall Seen by orthopedics.  Underwent surgery 8/9.  Pt noted to have some difficulties working with PT, limited by pain.  Cont with analgesia as needed Of note, pt fell only from 2nd step of ladder resulting in fracture. Likely diagnosis of osteoporosis Will need tobacco cessation and consider bisphosphonate at time of d/c  Leukocytosis Most likely reactive.  No evidence for infection.  He is afebrile. Would cont to monitor  Tobacco abuse Counseling was provided.  He is not ready to quit.  DVT prophylaxis: SCD's Code Status: Full Family Communication: Pt in room, family not at bedside  Status is: Inpatient  Remains inpatient appropriate because:Inpatient level of care appropriate due to severity of illness  Dispo: The patient is from: Home              Anticipated d/c is to: SNF              Patient currently is not medically stable to d/c.   Difficult to place patient No    Consultants:  Orthopedic Surgery  Procedures:  Hip surgery 8/9  Antimicrobials: Anti-infectives (From admission, onward)    Start     Dose/Rate Route Frequency Ordered Stop   11/25/20 1700  vancomycin (VANCOCIN) IVPB 1000 mg/200 mL premix        1,000 mg 200 mL/hr  over 60 Minutes Intravenous Every 12 hours 11/25/20 1602 11/26/20 0459   11/25/20 0600  vancomycin (VANCOCIN) IVPB 1000 mg/200 mL premix        1,000 mg 200 mL/hr over 60 Minutes Intravenous On call to O.R. 11/24/20 1800 11/25/20 1407       Subjective: Reports residual post-op hip pain this AM  Objective: Vitals:   11/25/20 2255 11/26/20 0306 11/26/20 0520 11/26/20 1500  BP: 127/80 101/61 117/75 118/76  Pulse: 79 61 (!) 59 (!) 58  Resp: 19 19 19 19   Temp: 97.9 F (36.6 C) 97.7 F (36.5 C) 97.7 F (36.5 C) 97.8 F (36.6 C)  TempSrc: Oral Oral Oral Oral  SpO2: 97% 95% 96% 97%  Weight:      Height:        Intake/Output Summary (Last 24 hours) at 11/26/2020 1719 Last data filed at 11/26/2020 1500 Gross per 24 hour  Intake 2236.85 ml  Output 3600 ml  Net -1363.15 ml   Filed Weights   11/23/20 1621 11/25/20 1227  Weight: 65.8 kg 65.8 kg    Examination: General exam: Awake, laying in bed, in nad Respiratory system: Normal respiratory effort, no wheezing Cardiovascular system: regular rate, s1, s2 Gastrointestinal system: Soft, nondistended, positive BS Central nervous system: CN2-12 grossly intact, strength intact Extremities: Perfused, no clubbing Skin: Normal skin turgor, no notable skin lesions seen Psychiatry: Mood normal // no visual hallucinations   Data Reviewed: I  have personally reviewed following labs and imaging studies  CBC: Recent Labs  Lab 11/23/20 1640 11/24/20 0859 11/25/20 0330 11/26/20 0538  WBC 17.4* 14.0* 12.4* 20.5*  NEUTROABS 15.2*  --   --   --   HGB 14.8 14.2 13.5 12.4*  HCT 43.8 42.4 40.3 37.3*  MCV 96.5 97.7 96.9 97.9  PLT 213 186 179 169   Basic Metabolic Panel: Recent Labs  Lab 11/23/20 1640 11/24/20 0859 11/25/20 0330 11/26/20 0538  NA 137 136 136 136  K 3.8 3.9 3.9 4.2  CL 107 107 103 105  CO2 26 24 24 25   GLUCOSE 75 102* 96 194*  BUN 15 19 17 18   CREATININE 0.79 0.75 0.72 0.72  CALCIUM 9.0 8.5* 8.6* 8.7*    GFR: Estimated Creatinine Clearance: 93 mL/min (by C-G formula based on SCr of 0.72 mg/dL). Liver Function Tests: Recent Labs  Lab 11/23/20 1640  AST 18  ALT 14  ALKPHOS 63  BILITOT 0.8  PROT 7.1  ALBUMIN 4.3   No results for input(s): LIPASE, AMYLASE in the last 168 hours. No results for input(s): AMMONIA in the last 168 hours. Coagulation Profile: No results for input(s): INR, PROTIME in the last 168 hours. Cardiac Enzymes: No results for input(s): CKTOTAL, CKMB, CKMBINDEX, TROPONINI in the last 168 hours. BNP (last 3 results) No results for input(s): PROBNP in the last 8760 hours. HbA1C: No results for input(s): HGBA1C in the last 72 hours. CBG: Recent Labs  Lab 11/26/20 0712 11/26/20 1111 11/26/20 1615  GLUCAP 162* 171* 114*   Lipid Profile: No results for input(s): CHOL, HDL, LDLCALC, TRIG, CHOLHDL, LDLDIRECT in the last 72 hours. Thyroid Function Tests: No results for input(s): TSH, T4TOTAL, FREET4, T3FREE, THYROIDAB in the last 72 hours. Anemia Panel: No results for input(s): VITAMINB12, FOLATE, FERRITIN, TIBC, IRON, RETICCTPCT in the last 72 hours. Sepsis Labs: No results for input(s): PROCALCITON, LATICACIDVEN in the last 168 hours.  Recent Results (from the past 240 hour(s))  Resp Panel by RT-PCR (Flu A&B, Covid) Nasopharyngeal Swab     Status: None   Collection Time: 11/23/20  5:06 PM   Specimen: Nasopharyngeal Swab; Nasopharyngeal(NP) swabs in vial transport medium  Result Value Ref Range Status   SARS Coronavirus 2 by RT PCR NEGATIVE NEGATIVE Final    Comment: (NOTE) SARS-CoV-2 target nucleic acids are NOT DETECTED.  The SARS-CoV-2 RNA is generally detectable in upper respiratory specimens during the acute phase of infection. The lowest concentration of SARS-CoV-2 viral copies this assay can detect is 138 copies/mL. A negative result does not preclude SARS-Cov-2 infection and should not be used as the sole basis for treatment or other patient  management decisions. A negative result may occur with  improper specimen collection/handling, submission of specimen other than nasopharyngeal swab, presence of viral mutation(s) within the areas targeted by this assay, and inadequate number of viral copies(<138 copies/mL). A negative result must be combined with clinical observations, patient history, and epidemiological information. The expected result is Negative.  Fact Sheet for Patients:  01/26/21  Fact Sheet for Healthcare Providers:  01/23/21  This test is no t yet approved or cleared by the [employer] FDA and  has been authorized for detection and/or diagnosis of SARS-CoV-2 by FDA under an Emergency Use Authorization (EUA). This EUA will remain  in effect (meaning this test can be used) for the duration of the COVID-19 declaration under Section 564(b)(1) of the Act, 21 U.S.C.section 360bbb-3(b)(1), unless the authorization is terminated  or revoked  sooner.       Influenza A by PCR NEGATIVE NEGATIVE Final   Influenza B by PCR NEGATIVE NEGATIVE Final    Comment: (NOTE) The Xpert Xpress SARS-CoV-2/FLU/RSV plus assay is intended as an aid in the diagnosis of influenza from Nasopharyngeal swab specimens and should not be used as a sole basis for treatment. Nasal washings and aspirates are unacceptable for Xpert Xpress SARS-CoV-2/FLU/RSV testing.  Fact Sheet for Patients: BloggerCourse.com  Fact Sheet for Healthcare Providers: SeriousBroker.it  This test is not yet approved or cleared by the [place] FDA and has been authorized for detection and/or diagnosis of SARS-CoV-2 by FDA under an Emergency Use Authorization (EUA). This EUA will remain in effect (meaning this test can be used) for the duration of the COVID-19 declaration under Section 564(b)(1) of the Act, 21 U.S.C. section 360bbb-3(b)(1),  unless the authorization is terminated or revoked.  Performed at [facility], [address], [place], [place] [address]   Surgical pcr screen     Status: None   Collection Time: 11/24/20  6:57 PM   Specimen: Nasal Mucosa; Nasal Swab  Result Value Ref Range Status   MRSA, PCR NEGATIVE NEGATIVE Final   Staphylococcus aureus NEGATIVE NEGATIVE Final    Comment: (NOTE) The Xpert SA Assay (FDA approved for NASAL specimens in patients 47 years of age and older), is one component of a comprehensive surveillance program. It is not intended to diagnose infection nor to guide or monitor treatment. Performed at [facility], [address] [place], [place] [address]      Radiology Studies: DG HIP OPERATIVE UNILAT WITH PELVIS LEFT  Result Date: 11/25/2020 CLINICAL DATA:  Fall, intertrochanteric hip fracture. EXAM: OPERATIVE LEFT HIP (WITH PELVIS IF PERFORMED) TECHNIQUE: Fluoroscopic spot image(s) were submitted for interpretation post-operatively. COMPARISON:  Preoperative radiograph 11/23/2020 FINDINGS: Ten fluoroscopic spot views of the left hip obtained in the operating room. Intramedullary nail with trans trochanteric and distal locking screw fixation traverse intertrochanteric femur fracture. Fluoroscopy time 2 minutes 24 seconds. Dose 30.85 mGy. IMPRESSION: Procedural fluoroscopy for ORIF left intertrochanteric femur fracture fixation. Electronically Signed   By: Narda Rutherford M.D.   On: 11/25/2020 15:04    Scheduled Meds:  aspirin EC  325 mg Oral Q breakfast   celecoxib  200 mg Oral BID   Chlorhexidine Gluconate Cloth  6 each Topical Daily   docusate sodium  100 mg Oral BID   traMADol  50 mg Oral Q6H   Continuous Infusions:  methocarbamol (ROBAXIN) IV       LOS: 3 days   Rickey Barbara, MD [facility] Pager On Amion  If 7PM-7AM, please contact night-coverage 11/26/2020, 5:19 PM

## 2020-11-27 LAB — BASIC METABOLIC PANEL
Anion gap: 4 — ABNORMAL LOW (ref 5–15)
BUN: 21 mg/dL — ABNORMAL HIGH (ref 6–20)
CO2: 26 mmol/L (ref 22–32)
Calcium: 8.4 mg/dL — ABNORMAL LOW (ref 8.9–10.3)
Chloride: 106 mmol/L (ref 98–111)
Creatinine, Ser: 0.92 mg/dL (ref 0.61–1.24)
GFR, Estimated: >60 mL/min (ref >60)
Glucose, Bld: 77 mg/dL (ref 70–99)
Potassium: 4.3 mmol/L (ref 3.5–5.1)
Sodium: 136 mmol/L (ref 135–145)

## 2020-11-27 LAB — CBC
HCT: 33.6 % — ABNORMAL LOW (ref 39.0–52.0)
Hemoglobin: 10.9 g/dL — ABNORMAL LOW (ref 13.0–17.0)
MCH: 32.1 pg (ref 26.0–34.0)
MCHC: 32.4 g/dL (ref 30.0–36.0)
MCV: 98.8 fL (ref 80.0–100.0)
Platelets: 170 10*3/uL (ref 150–400)
RBC: 3.4 MIL/uL — ABNORMAL LOW (ref 4.22–5.81)
RDW: 12.6 % (ref 11.5–15.5)
WBC: 14.3 10*3/uL — ABNORMAL HIGH (ref 4.0–10.5)
nRBC: 0 % (ref 0.0–0.2)

## 2020-11-27 NOTE — Progress Notes (Signed)
Patient ID: Caleb Blake, male   DOB: 10-11-1965, 55 y.o.   MRN: [id number]  This is postop day 2 status post open treatment internal fixation left hip fracture with gamma nail  The patient is improving his gait is improving his pain is relatively under control  Recommend continued physical therapy  Follow-up x-ray in 4 weeks

## 2020-11-27 NOTE — Progress Notes (Signed)
PROGRESS NOTE    Caleb Blake  NFA:[id number] DOB: 01-28-1966 DOA: 11/23/2020 PCP: Patient, No Pcp Per (Inactive)    Brief Narrative:  55 y.o. male with medical history significant for tobacco abuse, mechanical fall in 2021 from intake while reaching and trying to paint, with subsequent multiple rib fractures, right hemothorax.  Presented after fall from a 2 step ladder.  He fell on his left side.  Evaluation revealed left intertrochanteric fracture.  Patient was hospitalized for further management.  Assessment & Plan:   Principal Problem:   Closed left hip fracture (HCC) Active Problems:   Cigarette smoker   Closed 2-part intertrochanteric fracture of femur, initial encounter (HCC)  Left hip fracture secondary to mechanical fall Seen by orthopedics.  Underwent surgery 8/9.  Pt noted to have some difficulties working with PT, limited by pain.  Cont with analgesia as needed Of note, pt fell only from 2nd step of ladder resulting in fracture. Likely diagnosis of osteoporosis Will need tobacco cessation and would recommend starting bisphosphonate after d/c Likely SNF discharge tomorrow per TOC  Leukocytosis Most likely reactive.  No evidence for infection.  He is afebrile. Would cont to monitor  Tobacco abuse Counseling was provided.  He is not ready to quit.  DVT prophylaxis: SCD's Code Status: Full Family Communication: Pt in room, family not at bedside  Status is: Inpatient  Remains inpatient appropriate because:Inpatient level of care appropriate due to severity of illness  Dispo: The patient is from: Home              Anticipated d/c is to: SNF              Patient currently is not medically stable to d/c.   Difficult to place patient No   Consultants:  Orthopedic Surgery  Procedures:  Hip surgery 8/9  Antimicrobials: Anti-infectives (From admission, onward)    Start     Dose/Rate Route Frequency Ordered Stop   11/25/20 1700  vancomycin (VANCOCIN) IVPB 1000  mg/200 mL premix        1,000 mg 200 mL/hr over 60 Minutes Intravenous Every 12 hours 11/25/20 1602 11/26/20 0459   11/25/20 0600  vancomycin (VANCOCIN) IVPB 1000 mg/200 mL premix        1,000 mg 200 mL/hr over 60 Minutes Intravenous On call to O.R. 11/24/20 1800 11/25/20 1407       Subjective: Eager to go to rehab, is motivated to get better  Objective: Vitals:   11/26/20 2202 11/27/20 0531 11/27/20 0750 11/27/20 1350  BP: 118/75 102/72 114/76 110/76  Pulse: (!) 59 (!) 56 (!) 52 60  Resp: 18 19 20 19   Temp: 98.4 F (36.9 C) 97.6 F (36.4 C)  97.6 F (36.4 C)  TempSrc: Oral Oral  Oral  SpO2: 96% 98%  99%  Weight:      Height:        Intake/Output Summary (Last 24 hours) at 11/27/2020 1403 Last data filed at 11/27/2020 1349 Gross per 24 hour  Intake 1680 ml  Output 1400 ml  Net 280 ml    Filed Weights   11/23/20 1621 11/25/20 1227  Weight: 65.8 kg 65.8 kg    Examination: General exam: Conversant, in no acute distress Respiratory system: normal chest rise, clear, no audible wheezing Cardiovascular system: regular rhythm, s1-s2 Gastrointestinal system: Nondistended, nontender, pos BS Central nervous system: No seizures, no tremors Extremities: No cyanosis, no joint deformities Skin: No rashes, no pallor Psychiatry: Affect normal // no auditory hallucinations  Data Reviewed: I have personally reviewed following labs and imaging studies  CBC: Recent Labs  Lab 11/23/20 1640 11/24/20 0859 11/25/20 0330 11/26/20 0538 11/27/20 0615  WBC 17.4* 14.0* 12.4* 20.5* 14.3*  NEUTROABS 15.2*  --   --   --   --   HGB 14.8 14.2 13.5 12.4* 10.9*  HCT 43.8 42.4 40.3 37.3* 33.6*  MCV 96.5 97.7 96.9 97.9 98.8  PLT 213 186 179 169 170    Basic Metabolic Panel: Recent Labs  Lab 11/23/20 1640 11/24/20 0859 11/25/20 0330 11/26/20 0538 11/27/20 0615  NA 137 136 136 136 136  K 3.8 3.9 3.9 4.2 4.3  CL 107 107 103 105 106  CO2 26 24 24 25 26   GLUCOSE 75 102* 96 194*  77  BUN 15 19 17 18  21*  CREATININE 0.79 0.75 0.72 0.72 0.92  CALCIUM 9.0 8.5* 8.6* 8.7* 8.4*    GFR: Estimated Creatinine Clearance: 80.9 mL/min (by C-G formula based on SCr of 0.92 mg/dL). Liver Function Tests: Recent Labs  Lab 11/23/20 1640  AST 18  ALT 14  ALKPHOS 63  BILITOT 0.8  PROT 7.1  ALBUMIN 4.3    No results for input(s): LIPASE, AMYLASE in the last 168 hours. No results for input(s): AMMONIA in the last 168 hours. Coagulation Profile: No results for input(s): INR, PROTIME in the last 168 hours. Cardiac Enzymes: No results for input(s): CKTOTAL, CKMB, CKMBINDEX, TROPONINI in the last 168 hours. BNP (last 3 results) No results for input(s): PROBNP in the last 8760 hours. HbA1C: No results for input(s): HGBA1C in the last 72 hours. CBG: Recent Labs  Lab 11/26/20 0712 11/26/20 1111 11/26/20 1615 11/26/20 2200  GLUCAP 162* 171* 114* 121*    Lipid Profile: No results for input(s): CHOL, HDL, LDLCALC, TRIG, CHOLHDL, LDLDIRECT in the last 72 hours. Thyroid Function Tests: No results for input(s): TSH, T4TOTAL, FREET4, T3FREE, THYROIDAB in the last 72 hours. Anemia Panel: No results for input(s): VITAMINB12, FOLATE, FERRITIN, TIBC, IRON, RETICCTPCT in the last 72 hours. Sepsis Labs: No results for input(s): PROCALCITON, LATICACIDVEN in the last 168 hours.  Recent Results (from the past 240 hour(s))  Resp Panel by RT-PCR (Flu A&B, Covid) Nasopharyngeal Swab     Status: None   Collection Time: 11/23/20  5:06 PM   Specimen: Nasopharyngeal Swab; Nasopharyngeal(NP) swabs in vial transport medium  Result Value Ref Range Status   SARS Coronavirus 2 by RT PCR NEGATIVE NEGATIVE Final    Comment: (NOTE) SARS-CoV-2 target nucleic acids are NOT DETECTED.  The SARS-CoV-2 RNA is generally detectable in upper respiratory specimens during the acute phase of infection. The lowest concentration of SARS-CoV-2 viral copies this assay can detect is 138 copies/mL. A  negative result does not preclude SARS-Cov-2 infection and should not be used as the sole basis for treatment or other patient management decisions. A negative result may occur with  improper specimen collection/handling, submission of specimen other than nasopharyngeal swab, presence of viral mutation(s) within the areas targeted by this assay, and inadequate number of viral copies(<138 copies/mL). A negative result must be combined with clinical observations, patient history, and epidemiological information. The expected result is Negative.  Fact Sheet for Patients:  01/26/21  Fact Sheet for Healthcare Providers:  01/23/21  This test is no t yet approved or cleared by the [employer] FDA and  has been authorized for detection and/or diagnosis of SARS-CoV-2 by FDA under an Emergency Use Authorization (EUA). This EUA will remain  in effect (  meaning this test can be used) for the duration of the COVID-19 declaration under Section 564(b)(1) of the Act, 21 U.S.C.section 360bbb-3(b)(1), unless the authorization is terminated  or revoked sooner.       Influenza A by PCR NEGATIVE NEGATIVE Final   Influenza B by PCR NEGATIVE NEGATIVE Final    Comment: (NOTE) The Xpert Xpress SARS-CoV-2/FLU/RSV plus assay is intended as an aid in the diagnosis of influenza from Nasopharyngeal swab specimens and should not be used as a sole basis for treatment. Nasal washings and aspirates are unacceptable for Xpert Xpress SARS-CoV-2/FLU/RSV testing.  Fact Sheet for Patients: BloggerCourse.com  Fact Sheet for Healthcare Providers: SeriousBroker.it  This test is not yet approved or cleared by the [place] FDA and has been authorized for detection and/or diagnosis of SARS-CoV-2 by FDA under an Emergency Use Authorization (EUA). This EUA will remain in effect (meaning this test can  be used) for the duration of the COVID-19 declaration under Section 564(b)(1) of the Act, 21 U.S.C. section 360bbb-3(b)(1), unless the authorization is terminated or revoked.  Performed at [facility], [address] [place], [place] [address]   Surgical pcr screen     Status: None   Collection Time: 11/24/20  6:57 PM   Specimen: Nasal Mucosa; Nasal Swab  Result Value Ref Range Status   MRSA, PCR NEGATIVE NEGATIVE Final   Staphylococcus aureus NEGATIVE NEGATIVE Final    Comment: (NOTE) The Xpert SA Assay (FDA approved for NASAL specimens in patients 44 years of age and older), is one component of a comprehensive surveillance program. It is not intended to diagnose infection nor to guide or monitor treatment. Performed at [facility], [address] [place], [place] [address]       Radiology Studies: DG HIP OPERATIVE UNILAT WITH PELVIS LEFT  Result Date: 11/25/2020 CLINICAL DATA:  Fall, intertrochanteric hip fracture. EXAM: OPERATIVE LEFT HIP (WITH PELVIS IF PERFORMED) TECHNIQUE: Fluoroscopic spot image(s) were submitted for interpretation post-operatively. COMPARISON:  Preoperative radiograph 11/23/2020 FINDINGS: Ten fluoroscopic spot views of the left hip obtained in the operating room. Intramedullary nail with trans trochanteric and distal locking screw fixation traverse intertrochanteric femur fracture. Fluoroscopy time 2 minutes 24 seconds. Dose 30.85 mGy. IMPRESSION: Procedural fluoroscopy for ORIF left intertrochanteric femur fracture fixation. Electronically Signed   By: Narda Rutherford M.D.   On: 11/25/2020 15:04    Scheduled Meds:  aspirin EC  325 mg Oral Q breakfast   celecoxib  200 mg Oral BID   Chlorhexidine Gluconate Cloth  6 each Topical Daily   docusate sodium  100 mg Oral BID   traMADol  50 mg Oral Q6H   Continuous Infusions:  methocarbamol (ROBAXIN) IV       LOS: 4 days   Rickey Barbara, MD [facility] Pager On Amion  If 7PM-7AM, please contact  night-coverage 11/27/2020, 2:03 PM

## 2020-11-27 NOTE — Care Management Important Message (Signed)
Important Message  Patient Details  Name: Caleb Blake MRN: [id number] Date of Birth: October 04, 1965   Medicare Important Message Given:  Yes     Corey Harold 11/27/2020, 11:53 AM

## 2020-11-27 NOTE — TOC Progression Note (Addendum)
Transition of Care [facility]) - Progression Note    Patient Details  Name: Caleb Blake MRN: [id number] Date of Birth: [birth year]  [facility] CM/SW Contact  [place], [place] Phone Number: 11/27/2020, 9:53 AM  Clinical Narrative:    CSW spoke with pt about decision between SNF and home. Pt states that he feels SNF would be the best option. CSW spoke with pt about bed offers. Pt requests to stay in [place] and go to SNF at [place]. CSW updated this in [place] and insurance auth to be started. TOC to follow.   Addendum 1:44pm: CSW spoke to [place] at [facility] who states they can accept pt for admission tomorrow morning. CSW confirmed pt has insurance authorization. TOC to follow.   Expected Discharge Plan: Skilled Nursing Facility Barriers to Discharge: Continued Medical Work up  Expected Discharge Plan and Services Expected Discharge Plan: Skilled Nursing Facility In-house Referral: Clinical Social Work Discharge Planning Services: CM Consult Post Acute Care Choice: Skilled Nursing Facility Living arrangements for the past 2 months: Single Family Home                                       Social Determinants of Health (SDOH) Interventions    Readmission Risk Interventions Readmission Risk Prevention Plan 11/26/2020  Medication Screening Complete  Transportation Screening Complete  Some recent data might be hidden

## 2020-11-27 NOTE — Progress Notes (Signed)
Physical Therapy Treatment Patient Details Name: Caleb Blake MRN: [id number] DOB: [birth year] Today's Date: 11/27/2020    History of Present Illness Caleb Blake is a 55 y.o. male with medical history significant for tobacco abuse, mechanical fall in 2021 from intake while reaching and trying to paint, with subsequent multiple rib fractures, right hemothorax.    Patient presented to the ED again today with reports of a fall.  Patient's fell from a 2 step ladder while changing light bulbs.  Patient hit his head, and reports pain in his left leg. He denies dizziness, chest pain and difficulty breathing.  Patient reports he fell because he was holding a bulb with 1 hand, and trying to change a light bulb with the other hand, with both hands occupied, he lost his balance and fell. Fall resulted in hip fracture and ORIF on 8/9.    PT Comments    Patient begins session with supine exercises and educated on completing all exercises while in hospital. Patient does not require assist with bed mobility but transitions to EOB with labored movements. Patient transfers to standing with cueing for proper RW use and he only utilizes RLE to complete holding LLE flexed upon standing. Patient given cueing for LLE weight bearing and begins with lateral weight shifting with fair/poor weight acceptance on LLE due to c/o pain. Patient with several small lateral steps at bedside. Patient then begins ambulating with minimal weight bearing on LLE with forefoot weight bearing but is able to complete with improving gait pattern with cueing. Patient returned to bed at end of session. Patient will benefit from continued physical therapy in hospital and recommended venue below to increase strength, balance, endurance for safe ADLs and gait.   Follow Up Recommendations  SNF;Supervision/Assistance - 24 hour;Supervision for mobility/OOB     Equipment Recommendations  Rolling walker with 5" wheels (if returning home at  discharge from hospital)    Recommendations for Other Services       Precautions / Restrictions Precautions Precautions: Fall Restrictions LLE Weight Bearing: Weight bearing as tolerated    Mobility  Bed Mobility Overal bed mobility: Modified Independent       Supine to sit: HOB elevated     General bed mobility comments: labored transition to seated EOB    Transfers Overall transfer level: Needs assistance Equipment used: Rolling walker (2 wheeled) Transfers: Sit to/from Stand Sit to Stand: Min assist;Min guard         General transfer comment: Cueing for RW use and hand placement with RW; patient utilizes only RLE to come to standing  Ambulation/Gait Ambulation/Gait assistance: Min assist Gait Distance (Feet): 40 Feet Assistive device: Rolling walker (2 wheeled) Gait Pattern/deviations: Step-to pattern;Antalgic Gait velocity: decreased   General Gait Details: minimal use of LLE upon standing; performs weight shifting intially, frequent cueing for proper gait mechanics   Stairs             Wheelchair Mobility    Modified Rankin (Stroke Patients Only)       Balance Overall balance assessment: Needs assistance Sitting-balance support: No upper extremity supported;Feet supported Sitting balance-Leahy Scale: Normal Sitting balance - Comments: seated EOB   Standing balance support: Bilateral upper extremity supported Standing balance-Leahy Scale: Poor Standing balance comment: relies on RW with UE support                            Cognition Arousal/Alertness: Awake/alert Behavior During Therapy: [facility] for tasks  assessed/performed Overall Cognitive Status: Within Functional Limits for tasks assessed                                        Exercises General Exercises - Lower Extremity Ankle Circles/Pumps: AROM;Both;10 reps;Supine Gluteal Sets: AROM;Both;10 reps;Supine Heel Slides: AROM;Left;5 reps;Supine Other  Exercises Other Exercises: bridges x 10    General Comments        Pertinent Vitals/Pain Pain Assessment: No/denies pain (at rest)    Home Living                      Prior Function            PT Goals (current goals can now be found in the care plan section) Acute Rehab PT Goals Patient Stated Goal: Return home PT Goal Formulation: With patient Time For Goal Achievement: 12/10/20 Potential to Achieve Goals: Good Progress towards PT goals: Progressing toward goals    Frequency    Min 3X/week      PT Plan      Co-evaluation              AM-PAC PT "6 Clicks" Mobility   Outcome Measure  Help needed turning from your back to your side while in a flat bed without using bedrails?: None Help needed moving from lying on your back to sitting on the side of a flat bed without using bedrails?: None Help needed moving to and from a bed to a chair (including a wheelchair)?: A Little Help needed standing up from a chair using your arms (e.g., wheelchair or bedside chair)?: A Little Help needed to walk in hospital room?: A Lot Help needed climbing 3-5 steps with a railing? : Total 6 Click Score: 17    End of Session Equipment Utilized During Treatment: Gait belt Activity Tolerance: Patient tolerated treatment well;Patient limited by pain Patient left: in bed;with call bell/phone within reach Nurse Communication: Mobility status PT Visit Diagnosis: Unsteadiness on feet (R26.81);Other abnormalities of gait and mobility (R26.89);Muscle weakness (generalized) (M62.81)     Time: 0454-0981 PT Time Calculation (min) (ACUTE ONLY): 25 min  Charges:  $Therapeutic Exercise: 8-22 mins $Therapeutic Activity: 8-22 mins                     10:57 AM, 11/27/20 Wyman Songster PT, DPT Physical Therapist at [facility]

## 2020-11-28 LAB — CBC
HCT: 34.8 % — ABNORMAL LOW (ref 39.0–52.0)
Hemoglobin: 11.4 g/dL — ABNORMAL LOW (ref 13.0–17.0)
MCH: 32.7 pg (ref 26.0–34.0)
MCHC: 32.8 g/dL (ref 30.0–36.0)
MCV: 99.7 fL (ref 80.0–100.0)
Platelets: 185 10*3/uL (ref 150–400)
RBC: 3.49 MIL/uL — ABNORMAL LOW (ref 4.22–5.81)
RDW: 12.9 % (ref 11.5–15.5)
WBC: 9.7 10*3/uL (ref 4.0–10.5)
nRBC: 0 % (ref 0.0–0.2)

## 2020-11-28 MED ORDER — DOCUSATE SODIUM 100 MG PO CAPS: 100.0000 mg | ORAL_CAPSULE | Freq: Two times a day (BID) | ORAL | 0 refills | Status: AC

## 2020-11-28 MED ORDER — METHOCARBAMOL 500 MG PO TABS: 500.0000 mg | ORAL_TABLET | Freq: Four times a day (QID) | ORAL | 0 refills | Status: DC | PRN

## 2020-11-28 MED ORDER — HYDROCODONE-ACETAMINOPHEN 5-325 MG PO TABS: 1.0000 | ORAL_TABLET | ORAL | 0 refills | Status: DC | PRN

## 2020-11-28 NOTE — Progress Notes (Signed)
Pt discharged to [facility], pt wheeled down to main entrance lobby by staff via wheelchair. Discharge instructions given to pt and no questions at this time. Scripts sent with pt to give to facility staff.

## 2020-11-28 NOTE — Care Management Important Message (Signed)
Important Message  Patient Details  Name: Caleb Blake MRN: [id number] Date of Birth: May 04, 1965   Medicare Important Message Given:  Yes     Corey Harold 11/28/2020, 11:26 AM

## 2020-11-28 NOTE — Discharge Summary (Signed)
Physician Discharge Summary  Caleb Blake ZOX:[id number] DOB: August 15, 1965 DOA: 11/23/2020  PCP: Patient, No Pcp Per (Inactive)  Admit date: 11/23/2020 Discharge date: 11/28/2020  Admitted From: Home Disposition:  SNF  Recommendations for Outpatient Follow-up:  Follow up with PCP in 1-2 weeks Follow up with Orthopedic Surgery as scheduled with f/u xray in 4 weeks Consider initiating bisphosphonate as outpatient  NCCSR reviewed. No recent controlled substances listed  Discharge Condition:Stable CODE STATUS:Full Diet recommendation: Regular   Brief/Interim Summary: 55 y.o. male with medical history significant for tobacco abuse, mechanical fall in 2021 from intake while reaching and trying to paint, with subsequent multiple rib fractures, right hemothorax.  Presented after fall from a 2 step ladder.  He fell on his left side.  Evaluation revealed left intertrochanteric fracture.  Patient was hospitalized for further management.  Discharge Diagnoses:  Principal Problem:   Closed left hip fracture (HCC) Active Problems:   Cigarette smoker   Closed 2-part intertrochanteric fracture of femur, initial encounter (HCC)  Left hip fracture secondary to mechanical fall Seen by orthopedics.  Underwent surgery 8/9.  Pt noted to have some difficulties working with PT, limited by pain.  Cont with analgesia as needed Of note, pt fell only from 2nd step of ladder resulting in fracture. Likely diagnosis of osteoporosis Will need tobacco cessation and would recommend starting bisphosphonate after d/c Plan for SNF discharge today Discussed with Orthopedic Surgery. Per Dr. Romeo Apple, recommendation to continue daily aspirin for dvt prophylaxis   Leukocytosis Most likely reactive.  No evidence for infection.  He is afebrile. Would cont to monitor  Tobacco abuse Counseling was provided.  He is not ready to quit.   Discharge Instructions   Allergies as of 11/28/2020       Reactions   Penicillins     Unknown reaction        Medication List     TAKE these medications    acetaminophen 325 MG tablet Commonly known as: TYLENOL Take 2 tablets (650 mg total) by mouth every 6 (six) hours as needed for mild pain (or Fever >/= 101).   aspirin 325 MG tablet Take 325 mg by mouth daily.   docusate sodium 100 MG capsule Commonly known as: COLACE Take 1 capsule (100 mg total) by mouth 2 (two) times daily.   HYDROcodone-acetaminophen 5-325 MG tablet Commonly known as: NORCO/VICODIN Take 1 tablet by mouth every 4 (four) hours as needed for moderate pain or severe pain (pain score 4-6).   methocarbamol 500 MG tablet Commonly known as: ROBAXIN Take 1 tablet (500 mg total) by mouth every 6 (six) hours as needed for muscle spasms.        Contact information for follow-up providers     Vickki Hearing, MD Follow up in 4 week(s).   Specialties: Orthopedic Surgery, Radiology Why: Hospital follow up Contact information: [address] [place] [address] [phone]              Contact information for after-discharge care     Destination     [facility] .   Service: Skilled Nursing Contact information: [address] [place] [address] [phone]                    Allergies  Allergen Reactions   Penicillins     Unknown reaction     Consultations: Orthopedic Surgery  Procedures/Studies: CT HEAD WO CONTRAST ( )  Result Date: 11/23/2020 CLINICAL DATA:  Head trauma,  abnormal mental status (Age 38-64y); Neck trauma, dangerous injury mechanism (Age 67-64y) EXAM: CT HEAD WITHOUT CONTRAST CT CERVICAL SPINE WITHOUT CONTRAST TECHNIQUE: Multidetector CT imaging of the head and cervical spine was performed following the standard protocol without intravenous contrast. Multiplanar CT image reconstructions of the cervical spine were also generated. COMPARISON:  None. FINDINGS: CT HEAD FINDINGS Brain:  No evidence of acute large vascular territory infarction, hemorrhage, hydrocephalus, extra-axial collection or mass lesion/mass effect. Mild atrophy. Vascular: No hyperdense vessel identified. Mild calcific atherosclerosis. Skull: No evidence of acute fracture. Sinuses/Orbits: Mild paranasal sinus mucosal thickening without air-fluid levels. No acute orbital findings. Other: No mastoid effusions. CT CERVICAL SPINE FINDINGS Alignment: No substantial sagittal subluxation. Mild levocurvature in the cervical spine and dextrocurvature of the cervicothoracic junction. Rotation of C1 on C2. 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 Soft tissues and spinal canal: No prevertebral fluid or swelling. No visible canal hematoma. Disc levels:  Mild-to-moderate multilevel degenerative disc disease. Upper chest: Visualized lung apices are clear. IMPRESSION: CT head: No evidence of acute intracranial abnormality. CT cervical spine: 1. No evidence of acute fracture. 2. Rotation of C1 on C2, most likely positional in the absence of a fixed torticollis. 3. 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 Electronically Signed   By: Feliberto Harts MD   On: 11/23/2020 18:49   CT Cervical Spine Wo Contrast  Result Date: 11/23/2020 CLINICAL DATA:  Head trauma, abnormal mental status (Age 49-64y); Neck trauma, dangerous injury mechanism (Age 74-64y) EXAM: CT HEAD WITHOUT CONTRAST CT CERVICAL SPINE WITHOUT CONTRAST TECHNIQUE: Multidetector CT imaging of the head and cervical spine was performed following the standard protocol without intravenous contrast. Multiplanar CT image reconstructions of the cervical spine were also generated. COMPARISON:  None. FINDINGS: CT HEAD FINDINGS Brain: No evidence of acute large vascular territory  infarction, hemorrhage, hydrocephalus, extra-axial collection or mass lesion/mass effect. Mild atrophy. Vascular: No hyperdense vessel identified. Mild calcific atherosclerosis. Skull: No evidence of acute fracture. Sinuses/Orbits: Mild paranasal sinus mucosal thickening without air-fluid levels. No acute orbital findings. Other: No mastoid effusions. CT CERVICAL SPINE FINDINGS Alignment: No substantial sagittal subluxation. Mild levocurvature in the cervical spine and dextrocurvature of the cervicothoracic junction. Rotation of C1 on C2. 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 Soft tissues and spinal canal: No prevertebral fluid or swelling. No visible canal hematoma. Disc levels:  Mild-to-moderate multilevel degenerative disc disease. Upper chest: Visualized lung apices are clear. IMPRESSION: CT head: No evidence of acute intracranial abnormality. CT cervical spine: 1. No evidence of acute fracture. 2. Rotation of C1 on C2, most likely positional in the absence of a fixed torticollis. 3. 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 Electronically Signed   By: Feliberto Harts MD   On: 11/23/2020 18:49   DG Pelvis Portable  Result Date: 11/23/2020 CLINICAL DATA:  Patient fell off 2 step ladder.  LEFT hip pain. EXAM: PORTABLE PELVIS 1-2 VIEWS COMPARISON:  10/04/2014 FINDINGS: There is a comminuted intertrochanteric fracture of the LEFT hip associated with minimal displacement. Remainder of the pelvis is intact. IMPRESSION: Intertrochanteric fracture of the LEFT hip. Electronically Signed   By: Norva Pavlov M.D.   On: 11/23/2020 17:41   DG Chest  Port 1 View  Result Date: 11/23/2020 CLINICAL DATA:  Larey Seat off 2 step ladder. EXAM: PORTABLE CHEST 1 VIEW COMPARISON:  10/17/2019 FINDINGS: Heart size is  normal. Lungs are clear. No pneumothorax. Numerous remote RIGHT rib fractures are present. No acute fracture. Degenerative changes are seen in the thoracolumbar spine. IMPRESSION: No evidence for acute  abnormality.  Remote RIGHT rib fractures. Electronically Signed   By: Norva Pavlov M.D.   On: 11/23/2020 17:42   DG HIP OPERATIVE UNILAT WITH PELVIS LEFT  Result Date: 11/25/2020 CLINICAL DATA:  Fall, intertrochanteric hip fracture. EXAM: OPERATIVE LEFT HIP (WITH PELVIS IF PERFORMED) TECHNIQUE: Fluoroscopic spot image(s) were submitted for interpretation post-operatively. COMPARISON:  Preoperative radiograph 11/23/2020 FINDINGS: Ten fluoroscopic spot views of the left hip obtained in the operating room. Intramedullary nail with trans trochanteric and distal locking screw fixation traverse intertrochanteric femur fracture. Fluoroscopy time 2 minutes 24 seconds. Dose 30.85 mGy. IMPRESSION: Procedural fluoroscopy for ORIF left intertrochanteric femur fracture fixation. Electronically Signed   By: Narda Rutherford M.D.   On: 11/25/2020 15:04   DG HIP UNILAT WITH PELVIS 2-3 VIEWS LEFT  Result Date: 11/23/2020 CLINICAL DATA:  Left hip pain, fall EXAM: DG HIP (WITH OR WITHOUT PELVIS) 2-3V LEFT COMPARISON:  None. FINDINGS: There is a left femoral intertrochanteric fracture. Minimal displacement. No subluxation or dislocation. IMPRESSION: Left femoral intertrochanteric fracture. Electronically Signed   By: Charlett Nose M.D.   On: 11/23/2020 19:06    Subjective: Eager to start rehab  Discharge Exam: Vitals:   11/27/20 2115 11/28/20 0504  BP: 123/82 113/66  Pulse: (!) 58 (!) 50  Resp: 17 18  Temp: 98.2 F (36.8 C) 97.8 F (36.6 C)  SpO2: 98% 98%   Vitals:   11/27/20 0750 11/27/20 1350 11/27/20 2115 11/28/20 0504  BP: 114/76 110/76 123/82 113/66  Pulse: (!) 52 60 (!) 58 (!) 50  Resp: 20 19 17 18   Temp:  97.6 F (36.4 C) 98.2 F (36.8 C) 97.8 F (36.6 C)  TempSrc:  Oral Oral Oral  SpO2:  99%  98% 98%  Weight:      Height:        General: Pt is alert, awake, not in acute distress Cardiovascular: RRR, S1/S2 + Respiratory: CTA bilaterally, no wheezing, no rhonchi Abdominal: Soft, NT, ND, bowel sounds + Extremities: no edema, no cyanosis   The results of significant diagnostics from this hospitalization (including imaging, microbiology, ancillary and laboratory) are listed below for reference.     Microbiology: Recent Results (from the past 240 hour(s))  Resp Panel by RT-PCR (Flu A&B, Covid) Nasopharyngeal Swab     Status: None   Collection Time: 11/23/20  5:06 PM   Specimen: Nasopharyngeal Swab; Nasopharyngeal(NP) swabs in vial transport medium  Result Value Ref Range Status   SARS Coronavirus 2 by RT PCR NEGATIVE NEGATIVE Final    Comment: (NOTE) SARS-CoV-2 target nucleic acids are NOT DETECTED.  The SARS-CoV-2 RNA is generally detectable in upper respiratory specimens during the acute phase of infection. The lowest concentration of SARS-CoV-2 viral copies this assay can detect is 138 copies/mL. A negative result does not preclude SARS-Cov-2 infection and should not be used as the sole basis for treatment or other patient management decisions. A negative result may occur with  improper specimen collection/handling, submission of specimen other than nasopharyngeal swab, presence of viral mutation(s) within the areas targeted by this assay, and inadequate number of viral copies(<138 copies/mL). A negative result must be combined with clinical observations, patient history,  and epidemiological information. The expected result is Negative.  Fact Sheet for Patients:  BloggerCourse.com  Fact Sheet for Healthcare Providers:  SeriousBroker.it  This test is no t yet approved or cleared by the [place] FDA and  has been authorized for detection and/or diagnosis of SARS-CoV-2 by FDA under an Emergency Use Authorization  (EUA). This EUA will remain  in effect (meaning this test can be used) for the duration of the COVID-19 declaration under Section 564(b)(1) of the Act, 21 U.S.C.section 360bbb-3(b)(1), unless the authorization is terminated  or revoked sooner.       Influenza A by PCR NEGATIVE NEGATIVE Final   Influenza B by PCR NEGATIVE NEGATIVE Final    Comment: (NOTE) The Xpert Xpress SARS-CoV-2/FLU/RSV plus assay is intended as an aid in the diagnosis of influenza from Nasopharyngeal swab specimens and should not be used as a sole basis for treatment. Nasal washings and aspirates are unacceptable for Xpert Xpress SARS-CoV-2/FLU/RSV testing.  Fact Sheet for Patients: BloggerCourse.com  Fact Sheet for Healthcare Providers: SeriousBroker.it  This test is not yet approved or cleared by the [place] FDA and has been authorized for detection and/or diagnosis of SARS-CoV-2 by FDA under an Emergency Use Authorization (EUA). This EUA will remain in effect (meaning this test can be used) for the duration of the COVID-19 declaration under Section 564(b)(1) of the Act, 21 U.S.C. section 360bbb-3(b)(1), unless the authorization is terminated or revoked.  Performed at [facility], [address] [place], [place] [address]   Surgical pcr screen     Status: None   Collection Time: 11/24/20  6:57 PM   Specimen: Nasal Mucosa; Nasal Swab  Result Value Ref Range Status   MRSA, PCR NEGATIVE NEGATIVE Final   Staphylococcus aureus NEGATIVE NEGATIVE Final    Comment: (NOTE) The Xpert SA Assay (FDA approved for NASAL specimens in patients 10 years of age and older), is one component of a comprehensive surveillance program. It is not intended to diagnose infection nor to guide or monitor treatment. Performed at [facility], [address], [place], [place] [address]      Labs: BNP (last 3 results) No results for input(s): BNP in the last 8760  hours. Basic Metabolic Panel: Recent Labs  Lab 11/23/20 1640 11/24/20 0859 11/25/20 0330 11/26/20 0538 11/27/20 0615  NA 137 136 136 136 136  K 3.8 3.9 3.9 4.2 4.3  CL 107 107 103 105 106  CO2 26 24 24 25 26   GLUCOSE 75 102* 96 194* 77  BUN 15 19 17 18  21*  CREATININE 0.79 0.75 0.72 0.72 0.92  CALCIUM 9.0 8.5* 8.6* 8.7* 8.4*   Liver Function Tests: Recent Labs  Lab 11/23/20 1640  AST 18  ALT 14  ALKPHOS 63  BILITOT 0.8  PROT 7.1  ALBUMIN 4.3   No results for input(s): LIPASE, AMYLASE in the last 168 hours. No results for input(s): AMMONIA in the last 168 hours. CBC: Recent Labs  Lab 11/23/20 1640 11/24/20 0859 11/25/20 0330 11/26/20 0538 11/27/20 0615 11/28/20 0608  WBC 17.4* 14.0* 12.4* 20.5* 14.3* 9.7  NEUTROABS 15.2*  --   --   --   --   --   HGB 14.8 14.2 13.5 12.4* 10.9* 11.4*  HCT 43.8 42.4 40.3 37.3* 33.6* 34.8*  MCV 96.5 97.7 96.9 97.9 98.8 99.7  PLT 213 186 179 169 170 185   Cardiac Enzymes: No results for input(s): CKTOTAL, CKMB, CKMBINDEX, TROPONINI in the last 168 hours. BNP: Invalid input(s): POCBNP  CBG: Recent Labs  Lab 11/26/20 0712 11/26/20 1111 11/26/20 1615 11/26/20 2200  GLUCAP 162* 171* 114* 121*   D-Dimer No results for input(s): DDIMER in the last 72 hours. Hgb A1c No results for input(s): HGBA1C in the last 72 hours. Lipid Profile No results for input(s): CHOL, HDL, LDLCALC, TRIG, CHOLHDL, LDLDIRECT in the last 72 hours. Thyroid function studies No results for input(s): TSH, T4TOTAL, T3FREE, THYROIDAB in the last 72 hours.  Invalid input(s): FREET3 Anemia work up No results for input(s): VITAMINB12, FOLATE, FERRITIN, TIBC, IRON, RETICCTPCT in the last 72 hours. Urinalysis No results found for: COLORURINE, APPEARANCEUR, LABSPEC, PHURINE, GLUCOSEU, HGBUR, BILIRUBINUR, KETONESUR, PROTEINUR, UROBILINOGEN, NITRITE, LEUKOCYTESUR Sepsis Labs Invalid input(s): PROCALCITONIN,  WBC,  LACTICIDVEN Microbiology Recent Results  (from the past 240 hour(s))  Resp Panel by RT-PCR (Flu A&B, Covid) Nasopharyngeal Swab     Status: None   Collection Time: 11/23/20  5:06 PM   Specimen: Nasopharyngeal Swab; Nasopharyngeal(NP) swabs in vial transport medium  Result Value Ref Range Status   SARS Coronavirus 2 by RT PCR NEGATIVE NEGATIVE Final    Comment: (NOTE) SARS-CoV-2 target nucleic acids are NOT DETECTED.  The SARS-CoV-2 RNA is generally detectable in upper respiratory specimens during the acute phase of infection. The lowest concentration of SARS-CoV-2 viral copies this assay can detect is 138 copies/mL. A negative result does not preclude SARS-Cov-2 infection and should not be used as the sole basis for treatment or other patient management decisions. A negative result may occur with  improper specimen collection/handling, submission of specimen other than nasopharyngeal swab, presence of viral mutation(s) within the areas targeted by this assay, and inadequate number of viral copies(<138 copies/mL). A negative result must be combined with clinical observations, patient history, and epidemiological information. The expected result is Negative.  Fact Sheet for Patients:  BloggerCourse.com  Fact Sheet for Healthcare Providers:  SeriousBroker.it  This test is no t yet approved or cleared by the [place] FDA and  has been authorized for detection and/or diagnosis of SARS-CoV-2 by FDA under an Emergency Use Authorization (EUA). This EUA will remain  in effect (meaning this test can be used) for the duration of the COVID-19 declaration under Section 564(b)(1) of the Act, 21 U.S.C.section 360bbb-3(b)(1), unless the authorization is terminated  or revoked sooner.       Influenza A by PCR NEGATIVE NEGATIVE Final   Influenza B by PCR NEGATIVE NEGATIVE Final    Comment: (NOTE) The Xpert Xpress SARS-CoV-2/FLU/RSV plus assay is intended as an aid in the  diagnosis of influenza from Nasopharyngeal swab specimens and should not be used as a sole basis for treatment. Nasal washings and aspirates are unacceptable for Xpert Xpress SARS-CoV-2/FLU/RSV testing.  Fact Sheet for Patients: BloggerCourse.com  Fact Sheet for Healthcare Providers: SeriousBroker.it  This test is not yet approved or cleared by the [place] FDA and has been authorized for detection and/or diagnosis of SARS-CoV-2 by FDA under an Emergency Use Authorization (EUA). This EUA will remain in effect (meaning this test can be used) for the duration of the COVID-19 declaration under Section 564(b)(1) of the Act, 21 U.S.C. section 360bbb-3(b)(1), unless the authorization is terminated or revoked.  Performed at [facility], [address], [place], [place] [address]   Surgical pcr screen     Status: None   Collection Time: 11/24/20  6:57 PM   Specimen: Nasal Mucosa; Nasal Swab  Result Value Ref Range Status   MRSA, PCR NEGATIVE NEGATIVE Final   Staphylococcus aureus  NEGATIVE NEGATIVE Final    Comment: (NOTE) The Xpert SA Assay (FDA approved for NASAL specimens in patients 57 years of age and older), is one component of a comprehensive surveillance program. It is not intended to diagnose infection nor to guide or monitor treatment. Performed at [facility], [address] [place], [place] [id number]    Time spent: 30 min  SIGNED:   Rickey BarbaraStephen Dolly Harbach, MD  [facility] 11/28/2020, 12:45 PM  If 7PM-7AM, please contact night-coverage

## 2020-11-28 NOTE — Progress Notes (Signed)
Pt has discharge orders. Report given to nurse at [facility], no further questions at this time. Pt will be transported by [facility] via wheelchair.

## 2020-11-28 NOTE — Progress Notes (Signed)
Physical Therapy Treatment Patient Details Name: Caleb Blake MRN: [id number] DOB: 09/11/1965 Today's Date: 11/28/2020    History of Present Illness Caleb Blake is a 55 y.o. male with medical history significant for tobacco abuse, mechanical fall in 2021 from intake while reaching and trying to paint, with subsequent multiple rib fractures, right hemothorax.    Patient presented to the ED again today with reports of a fall.  Patient's fell from a 2 step ladder while changing light bulbs.  Patient hit his head, and reports pain in his left leg. He denies dizziness, chest pain and difficulty breathing.  Patient reports he fell because he was holding a bulb with 1 hand, and trying to change a light bulb with the other hand, with both hands occupied, he lost his balance and fell. Fall resulted in hip fracture and ORIF on 8/9.    PT Comments    Patient with c/o increased pain today. Patient with minimal to no use of LLE with bed mobility or transfers. Patient completes standing exercises with fair mechanics following initial demonstrating and with cueing. Patient given frequent cueing for improving LLE weight acceptance with standing/ambulation with minimal carry over. Patient tending to hold LLE in flexed position with weightbearing on forefoot/toes. Patient educated on importance of improving mechanics and use of LLE to improve functional mobility in coming weeks. Patient will benefit from continued physical therapy in hospital and recommended venue below to increase strength, balance, endurance for safe ADLs and gait.   Follow Up Recommendations  SNF;Supervision/Assistance - 24 hour;Supervision for mobility/OOB     Equipment Recommendations  Rolling walker with 5" wheels (if returning home at discharge from hospital)    Recommendations for Other Services       Precautions / Restrictions Precautions Precautions: Fall Restrictions Weight Bearing Restrictions: Yes LLE Weight Bearing:  Weight bearing as tolerated    Mobility  Bed Mobility Overal bed mobility: Modified Independent       Supine to sit: HOB elevated     General bed mobility comments: labored transition to seated EOB    Transfers Overall transfer level: Needs assistance Equipment used: Rolling walker (2 wheeled) Transfers: Sit to/from Stand Sit to Stand: Min assist;Min guard         General transfer comment: Cueing for RW use and hand placement with RW; patient utilizes only RLE to come to standing  Ambulation/Gait Ambulation/Gait assistance: Min assist Gait Distance (Feet): 30 Feet Assistive device: Rolling walker (2 wheeled) Gait Pattern/deviations: Step-to pattern;Antalgic Gait velocity: decreased   General Gait Details: minimal use of LLE upon standing; performs weight shifting intially, frequent cueing for proper gait mechanics   Stairs             Wheelchair Mobility    Modified Rankin (Stroke Patients Only)       Balance Overall balance assessment: Needs assistance Sitting-balance support: No upper extremity supported;Feet supported Sitting balance-Leahy Scale: Normal Sitting balance - Comments: seated EOB   Standing balance support: Bilateral upper extremity supported Standing balance-Leahy Scale: Poor Standing balance comment: relies on RW with UE support                            Cognition Arousal/Alertness: Awake/alert Behavior During Therapy: WFL for tasks assessed/performed Overall Cognitive Status: Within Functional Limits for tasks assessed  Exercises Other Exercises Other Exercises: weight shifting x 20 lateral Other Exercises: standing march x 20 LLE Other Exercises: standing hip abduction x 20 LLE Other Exercises: standing hip extension x 20 LLE    General Comments        Pertinent Vitals/Pain Pain Assessment: 0-10 Pain Score: 8  Pain Location: L hip Pain Descriptors /  Indicators: Aching;Sore;Sharp Pain Intervention(s): Limited activity within patient's tolerance;Monitored during session;Repositioned    Home Living                      Prior Function            PT Goals (current goals can now be found in the care plan section) Acute Rehab PT Goals Patient Stated Goal: Return home PT Goal Formulation: With patient Time For Goal Achievement: 12/10/20 Potential to Achieve Goals: Good Progress towards PT goals: Progressing toward goals    Frequency    Min 3X/week      PT Plan Current plan remains appropriate    Co-evaluation              AM-PAC PT "6 Clicks" Mobility   Outcome Measure  Help needed turning from your back to your side while in a flat bed without using bedrails?: None Help needed moving from lying on your back to sitting on the side of a flat bed without using bedrails?: None Help needed moving to and from a bed to a chair (including a wheelchair)?: A Little Help needed standing up from a chair using your arms (e.g., wheelchair or bedside chair)?: A Little Help needed to walk in hospital room?: A Lot Help needed climbing 3-5 steps with a railing? : Total 6 Click Score: 17    End of Session Equipment Utilized During Treatment: Gait belt Activity Tolerance: Patient tolerated treatment well;Patient limited by pain Patient left: in bed;with call bell/phone within reach Nurse Communication: Mobility status PT Visit Diagnosis: Unsteadiness on feet (R26.81);Other abnormalities of gait and mobility (R26.89);Muscle weakness (generalized) (M62.81)     Time: 0034-9179 PT Time Calculation (min) (ACUTE ONLY): 19 min  Charges:  $Therapeutic Exercise: 8-22 mins                     10:49 AM, 11/28/20 Wyman Songster PT, DPT Physical Therapist at [facility]

## 2020-11-28 NOTE — TOC Transition Note (Signed)
Transition of Care [facility]) - CM/SW Discharge Note   Patient Details  Name: Caleb Blake MRN: [id number] Date of Birth: 03/05/1966  [facility] Care [facility]) CM/SW Contact:  Villa Herb, LCSWA Phone Number: 11/28/2020, 12:32 PM   Clinical Narrative:    CSW spoke to [place] with [facility] who states pt can arrive to facility today. Pt will be going to [facility] bed 1. CSW updated pt and pts brother [name] that pt will be d/c to [place] today. Pt is able to ride in a wheelchair. Due to lack of EMS trucks pt will be transported via wheelchair through [facility]. CSW asked RN that secretary set that up when pt is ready. TOC signing off.   Final next level of care: Skilled Nursing Facility Barriers to Discharge: Barriers Resolved   Patient Goals and CMS Choice Patient states their goals for this hospitalization and ongoing recovery are:: Go to rehab CMS Medicare.gov Compare Post Acute Care list provided to:: Patient Choice offered to / list presented to : Patient  Discharge Placement              Patient chooses bed at: Other - please specify in the comment section below: ([facility]) Patient to be transferred to facility by: [facility] Name of family member notified: [name] (brother) Patient and family notified of of transfer: 11/28/20  Discharge Plan and Services In-house Referral: Clinical Social Work Discharge Planning Services: CM Consult Post Acute Care Choice: Skilled Nursing Facility                               Social Determinants of Health (SDOH) Interventions     Readmission Risk Interventions Readmission Risk Prevention Plan 11/26/2020  Medication Screening Complete  Transportation Screening Complete  Some recent data might be hidden

## 2020-12-23 ENCOUNTER — Other Ambulatory Visit: Payer: Self-pay | Admitting: Orthopedic Surgery

## 2020-12-23 ENCOUNTER — Telehealth: Payer: Self-pay | Admitting: Orthopedic Surgery

## 2020-12-23 DIAGNOSIS — G8918 Other acute postprocedural pain: Secondary | ICD-10-CM

## 2020-12-23 MED ORDER — HYDROCODONE-ACETAMINOPHEN 5-325 MG PO TABS: 1.0000 | ORAL_TABLET | Freq: Four times a day (QID) | ORAL | 0 refills | Status: DC | PRN

## 2020-12-23 MED ORDER — HYDROCODONE-ACETAMINOPHEN 5-325 MG PO TABS: 1.0000 | ORAL_TABLET | Freq: Four times a day (QID) | ORAL | 0 refills | Status: AC | PRN

## 2020-12-23 NOTE — Telephone Encounter (Signed)
Patient had left message; called patient back - states home from facility; requesting refill: HYDROcodone-acetaminophen (NORCO/VICODIN) 5-325 MG tablet [facility], [address], [place]  - aware of post op/follow up visit on 12/25/20

## 2020-12-23 NOTE — Progress Notes (Signed)
Meds ordered this encounter  Medications   HYDROcodone-acetaminophen (NORCO/VICODIN) 5-325 MG tablet    Sig: Take 1 tablet by mouth every 6 (six) hours as needed for up to 5 days for moderate pain or severe pain (pain score 4-6).    Dispense:  20 tablet    Refill:  0  '

## 2020-12-25 ENCOUNTER — Ambulatory Visit: Payer: Medicare Other

## 2020-12-25 ENCOUNTER — Encounter: Payer: Self-pay | Admitting: Orthopedic Surgery

## 2020-12-25 ENCOUNTER — Ambulatory Visit (INDEPENDENT_AMBULATORY_CARE_PROVIDER_SITE_OTHER): Payer: Medicare Other | Admitting: Orthopedic Surgery

## 2020-12-25 ENCOUNTER — Other Ambulatory Visit: Payer: Self-pay

## 2020-12-25 DIAGNOSIS — S72002D Fracture of unspecified part of neck of left femur, subsequent encounter for closed fracture with routine healing: Secondary | ICD-10-CM

## 2020-12-25 MED ORDER — METHOCARBAMOL 500 MG PO TABS: 500.0000 mg | ORAL_TABLET | Freq: Four times a day (QID) | ORAL | 0 refills | Status: DC | PRN

## 2020-12-25 NOTE — Progress Notes (Signed)
Encounter Diagnosis  Name Primary?   Closed left hip fracture, with routine healing, subsequent encounter s/p IM nail 11/22/20 Yes    Chief Complaint  Patient presents with   Post-op Follow-up    11/22/20 IM nail left hip improving     Mr. Caleb Blake complains of intermittent muscle spasms  He is 1 month after IM nailing left hip for inner troches fracture  He has some mild to moderate discomfort in the left hip  His x-ray shows the implant is in stable position the fracture is in good position  He is ambulatory with a walker  We will add muscle relaxer and he already has a prescription for hydrocodone  Meds ordered this encounter  Medications   methocarbamol (ROBAXIN) 500 MG tablet    Sig: Take 1 tablet (500 mg total) by mouth every 6 (six) hours as needed for muscle spasms.    Dispense:  20 tablet    Refill:  0    Recommend 6-week follow-up repeat x-ray

## 2021-02-05 ENCOUNTER — Ambulatory Visit (INDEPENDENT_AMBULATORY_CARE_PROVIDER_SITE_OTHER): Payer: Medicare Other | Admitting: Orthopedic Surgery

## 2021-02-05 ENCOUNTER — Other Ambulatory Visit: Payer: Self-pay

## 2021-02-05 ENCOUNTER — Encounter: Payer: Self-pay | Admitting: Orthopedic Surgery

## 2021-02-05 ENCOUNTER — Ambulatory Visit: Payer: Medicare Other

## 2021-02-05 VITALS — Ht 66.0 in | Wt 145.0 lb

## 2021-02-05 DIAGNOSIS — S72002D Fracture of unspecified part of neck of left femur, subsequent encounter for closed fracture with routine healing: Secondary | ICD-10-CM

## 2021-02-05 DIAGNOSIS — M25562 Pain in left knee: Secondary | ICD-10-CM

## 2021-02-05 DIAGNOSIS — M1120 Other chondrocalcinosis, unspecified site: Secondary | ICD-10-CM

## 2021-02-05 MED ORDER — PREDNISONE 10 MG PO TABS
10.0000 mg | ORAL_TABLET | Freq: Three times a day (TID) | ORAL | 0 refills | Status: AC
Start: 2021-02-05 — End: 2021-02-15

## 2021-02-05 NOTE — Patient Instructions (Signed)
Start prednisone 10 mg 3 times a day for 10 days

## 2021-02-05 NOTE — Progress Notes (Signed)
Postop appointment  Encounter Diagnoses  Name Primary?  . Closed left hip fracture, with routine healing, subsequent encounter s/p IM nail 11/22/20 Yes  . Acute pain of left knee     Chief Complaint  Patient presents with  . Post-op Follow-up    Left hip IM nail 11/22/20 improving   . Knee Pain    Left/ requesting xrays due to pain, has been wrapping with an ace wrap helps some     10 weeks post IM nailing left hip for inner troches fracture with short gamma nail  C/o of acute left knee pain and swelling associated with warmth and loss of motion   Physical Exam Musculoskeletal:     Left hip: No deformity, tenderness, bony tenderness or crepitus. Normal range of motion. Normal strength.     Left knee: Effusion present. No swelling, deformity, erythema, ecchymosis, lacerations, bony tenderness or crepitus. Normal range of motion. No tenderness. No ACL laxity or PCL laxity.Normal alignment.     Left hip fracture healed   Left knee possible chondrocalcinosis   Start prednisone return 4 weeks for the knee   Meds ordered this encounter  Medications  . predniSONE (DELTASONE) 10 MG tablet    Sig: Take 1 tablet (10 mg total) by mouth 3 (three) times daily for 10 days.    Dispense:  30 tablet    Refill:  0

## 2021-02-22 ENCOUNTER — Encounter (HOSPITAL_COMMUNITY): Payer: Self-pay | Admitting: *Deleted

## 2021-02-22 ENCOUNTER — Emergency Department (HOSPITAL_COMMUNITY): Payer: Medicare Other

## 2021-02-22 ENCOUNTER — Emergency Department (HOSPITAL_COMMUNITY)
Admission: EM | Admit: 2021-02-22 | Discharge: 2021-02-22 | Disposition: A | Discharge status: Home / Self Care | Payer: Medicare Other | Attending: Emergency Medicine | Admitting: Emergency Medicine

## 2021-02-22 DIAGNOSIS — Z7982 Long term (current) use of aspirin: Secondary | ICD-10-CM | POA: Insufficient documentation

## 2021-02-22 DIAGNOSIS — R0789 Other chest pain: Secondary | ICD-10-CM | POA: Insufficient documentation

## 2021-02-22 DIAGNOSIS — Z87891 Personal history of nicotine dependence: Secondary | ICD-10-CM | POA: Diagnosis not present

## 2021-02-22 DIAGNOSIS — R079 Chest pain, unspecified: Secondary | ICD-10-CM | POA: Diagnosis present

## 2021-02-22 LAB — BASIC METABOLIC PANEL
Anion gap: 9 (ref 5–15)
BUN: 17 mg/dL (ref 6–20)
CO2: 28 mmol/L (ref 22–32)
Calcium: 9 mg/dL (ref 8.9–10.3)
Chloride: 102 mmol/L (ref 98–111)
Creatinine, Ser: 0.92 mg/dL (ref 0.61–1.24)
GFR, Estimated: >60 mL/min (ref >60)
Glucose, Bld: 82 mg/dL (ref 70–99)
Potassium: 3.8 mmol/L (ref 3.5–5.1)
Sodium: 139 mmol/L (ref 135–145)

## 2021-02-22 LAB — CBC
HCT: 43.8 % (ref 39.0–52.0)
Hemoglobin: 14.5 g/dL (ref 13.0–17.0)
MCH: 31.4 pg (ref 26.0–34.0)
MCHC: 33.1 g/dL (ref 30.0–36.0)
MCV: 94.8 fL (ref 80.0–100.0)
Platelets: 227 10*3/uL (ref 150–400)
RBC: 4.62 MIL/uL (ref 4.22–5.81)
RDW: 12.8 % (ref 11.5–15.5)
WBC: 11.9 10*3/uL — ABNORMAL HIGH (ref 4.0–10.5)
nRBC: 0 % (ref 0.0–0.2)

## 2021-02-22 LAB — D-DIMER, QUANTITATIVE: D-Dimer, Quant: 0.42 ug/mL-FEU (ref 0.00–0.50)

## 2021-02-22 LAB — TROPONIN I (HIGH SENSITIVITY)
Troponin I (High Sensitivity): 3 ng/L (ref <18)
Troponin I (High Sensitivity): 4 ng/L (ref <18)

## 2021-02-22 MED ORDER — NITROGLYCERIN 0.4 MG SL SUBL
0.4000 mg | SUBLINGUAL_TABLET | SUBLINGUAL | Status: DC | PRN
  Administered 2021-02-22: 0.4 mg via SUBLINGUAL
  Filled 2021-02-22 (×3): qty 1

## 2021-02-22 MED ORDER — ASPIRIN 81 MG PO CHEW
CHEWABLE_TABLET | ORAL | Status: AC
  Administered 2021-02-22: 324 mg via ORAL
  Filled 2021-02-22: qty 3

## 2021-02-22 MED ORDER — ASPIRIN 81 MG PO CHEW
324.0000 mg | CHEWABLE_TABLET | Freq: Once | ORAL | Status: AC
  Filled 2021-02-22: qty 4

## 2021-02-22 NOTE — ED Notes (Signed)
Unable to sign discharge papers, keypad not working.
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
## 2021-02-22 NOTE — Discharge Instructions (Addendum)
You have been evaluated for your chest pain.  Fortunately your EKG, labs and Xray did not show any concerning finding.  It is important for you to follow up with a cardiologist for further evaluation, which may include an outpatient cardiac stress test.  Return to the ER promptly if your symptoms return or if you have any other concerns.

## 2021-02-22 NOTE — ED Triage Notes (Signed)
Chest pain with nausea and shortness of breath onset an hour ago

## 2021-02-23 NOTE — ED Provider Notes (Signed)
Pt's care assumed at 7pm.  Pt's labs returned.  He is still pain free. I I discussed results and treatment plan with pt.  He is agreeable to plan   Osie Cheeks 02/23/21 3614    Eber Hong, MD 02/27/21 1432

## 2021-03-05 ENCOUNTER — Ambulatory Visit: Payer: Medicare Other | Admitting: Orthopedic Surgery
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
## 2021-12-23 IMAGING — RF DG HIP (WITH PELVIS) OPERATIVE*L*
1 series · 11 of 11 positions shown · non-contrast
Comparison: Preoperative radiograph 11/23/2020

CLINICAL DATA: Fall, intertrochanteric hip fracture.

EXAM:
OPERATIVE LEFT HIP (WITH PELVIS IF PERFORMED)
TECHNIQUE: Fluoroscopic spot image(s) were submitted for interpretation
post-operatively.

[Series 1: run · 11 of 11 slices shown]
[im 1/11]
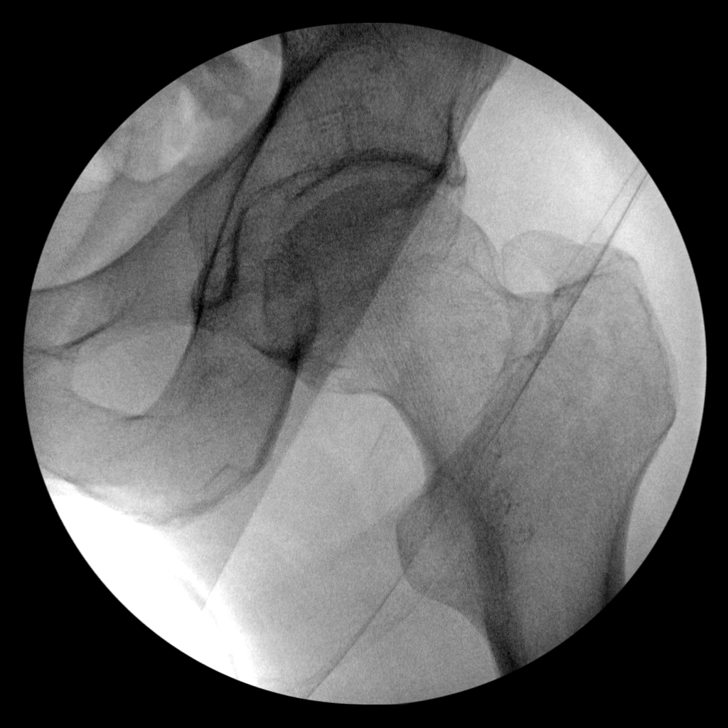
[im 2/11]
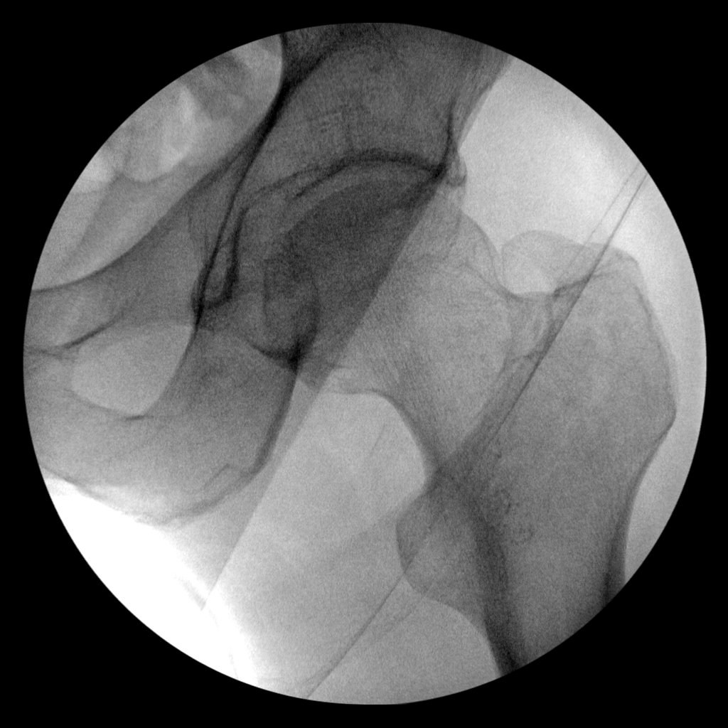
[im 3/11]
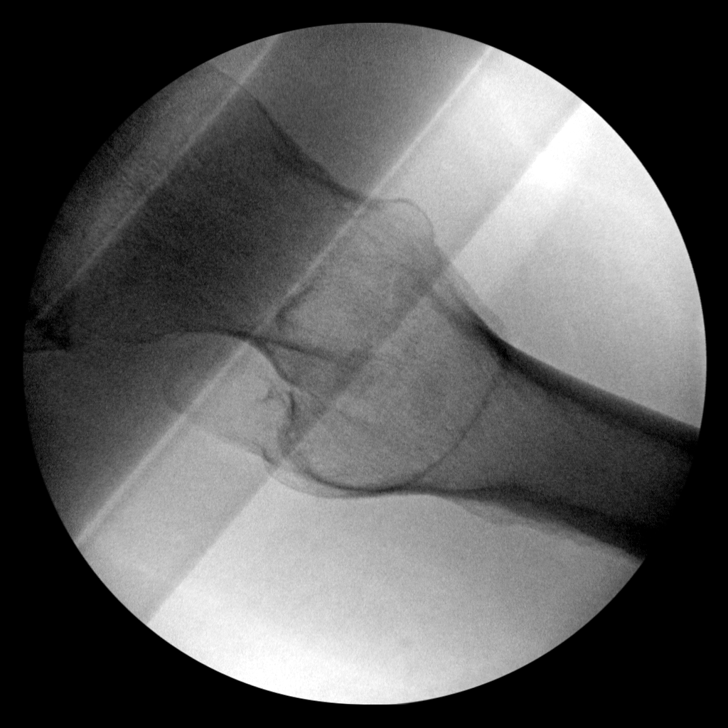
[im 4/11]
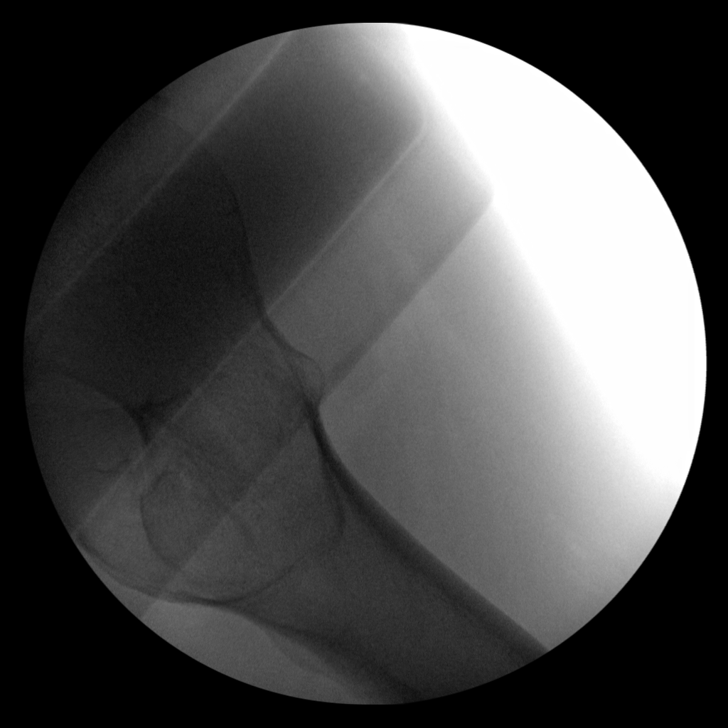
[im 5/11]
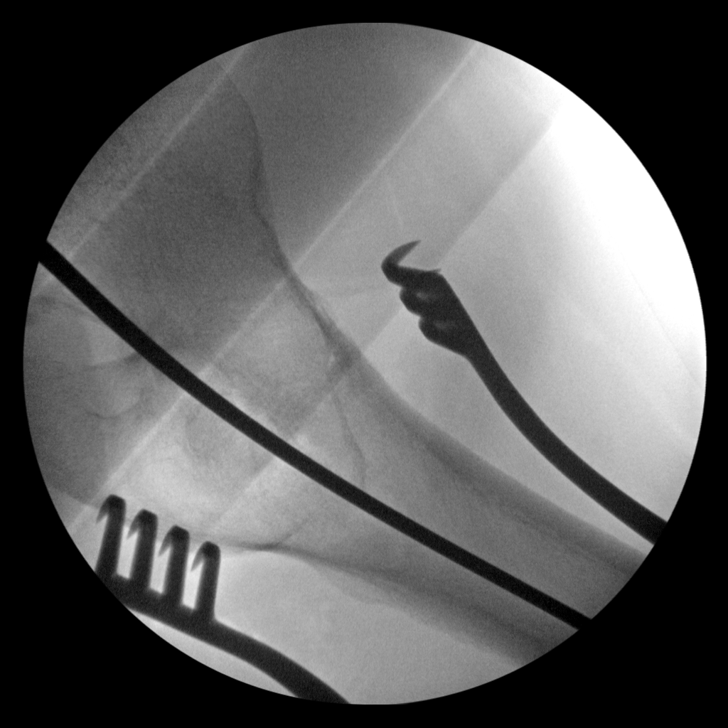
[im 6/11]
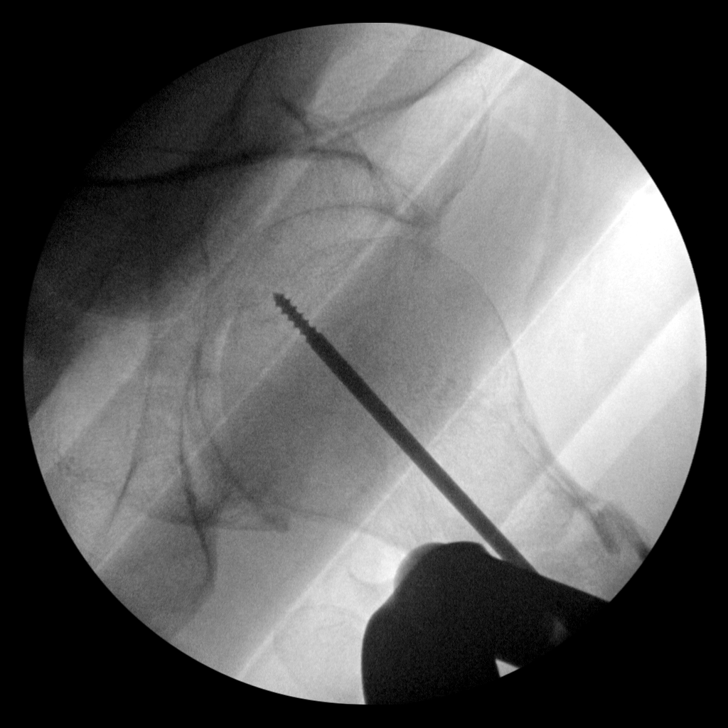
[im 7/11]
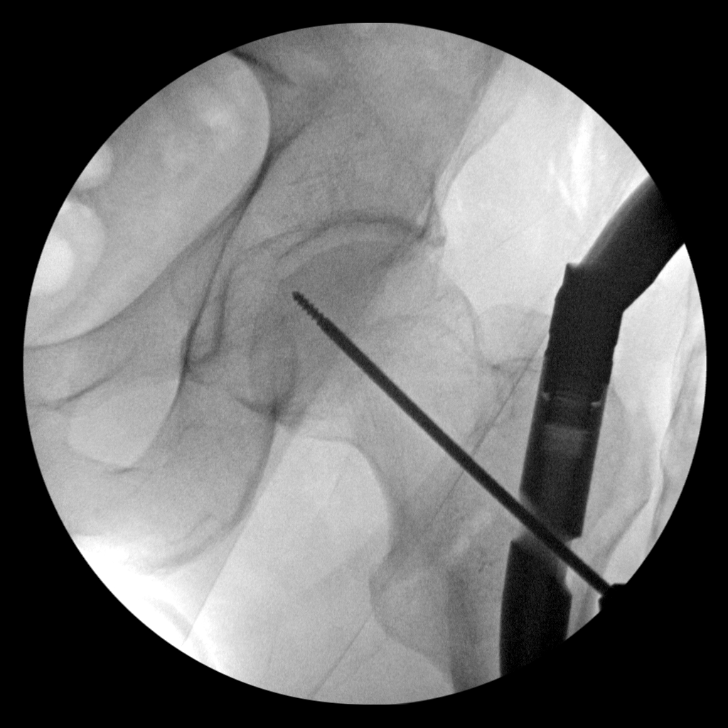
[im 8/11]
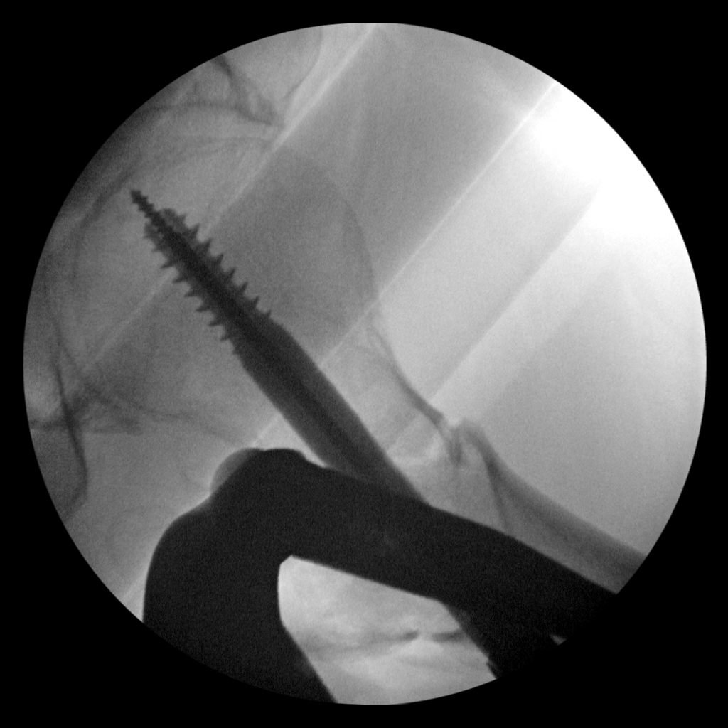
[im 9/11]
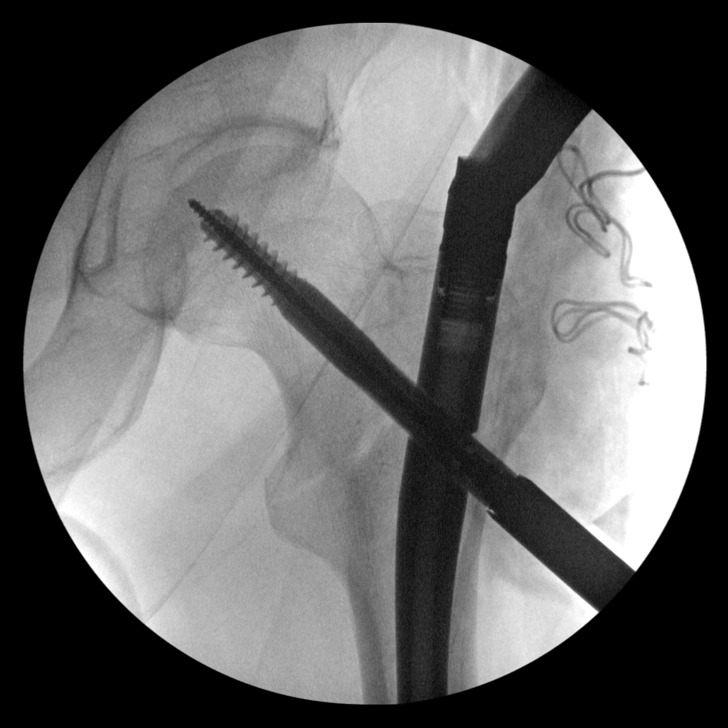
[im 10/11]
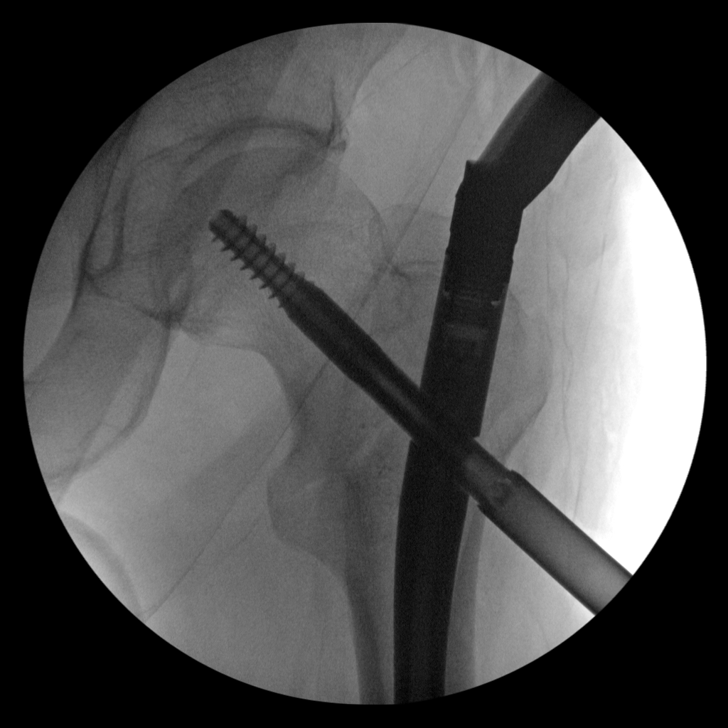
[im 11/11]
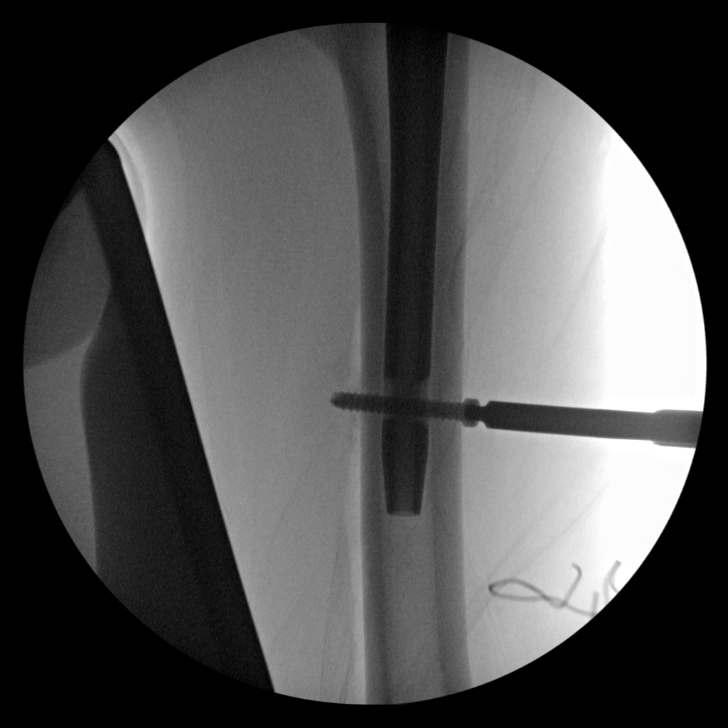

[11 of 11 positions shown; findings below may reference images not displayed]

FINDINGS: Ten fluoroscopic spot views of the left hip obtained in the
operating room. Intramedullary nail with trans trochanteric and
distal locking screw fixation traverse intertrochanteric femur
fracture. Fluoroscopy time 2 minutes 24 seconds. Dose 30.85 mGy.
IMPRESSION: Procedural fluoroscopy for ORIF left intertrochanteric femur
fracture fixation.

## 2022-02-10 NOTE — Telephone Encounter (Signed)
Unable to sign

## 2022-03-22 IMAGING — DX DG CHEST 1V PORT
1 series · 1 of 1 positions shown · non-contrast
Comparison: Radiograph 11/23/2020

CLINICAL DATA: Right-sided chest pain. Nausea and shortness of
breath

EXAM:
PORTABLE CHEST 1 VIEW

[chest ap]
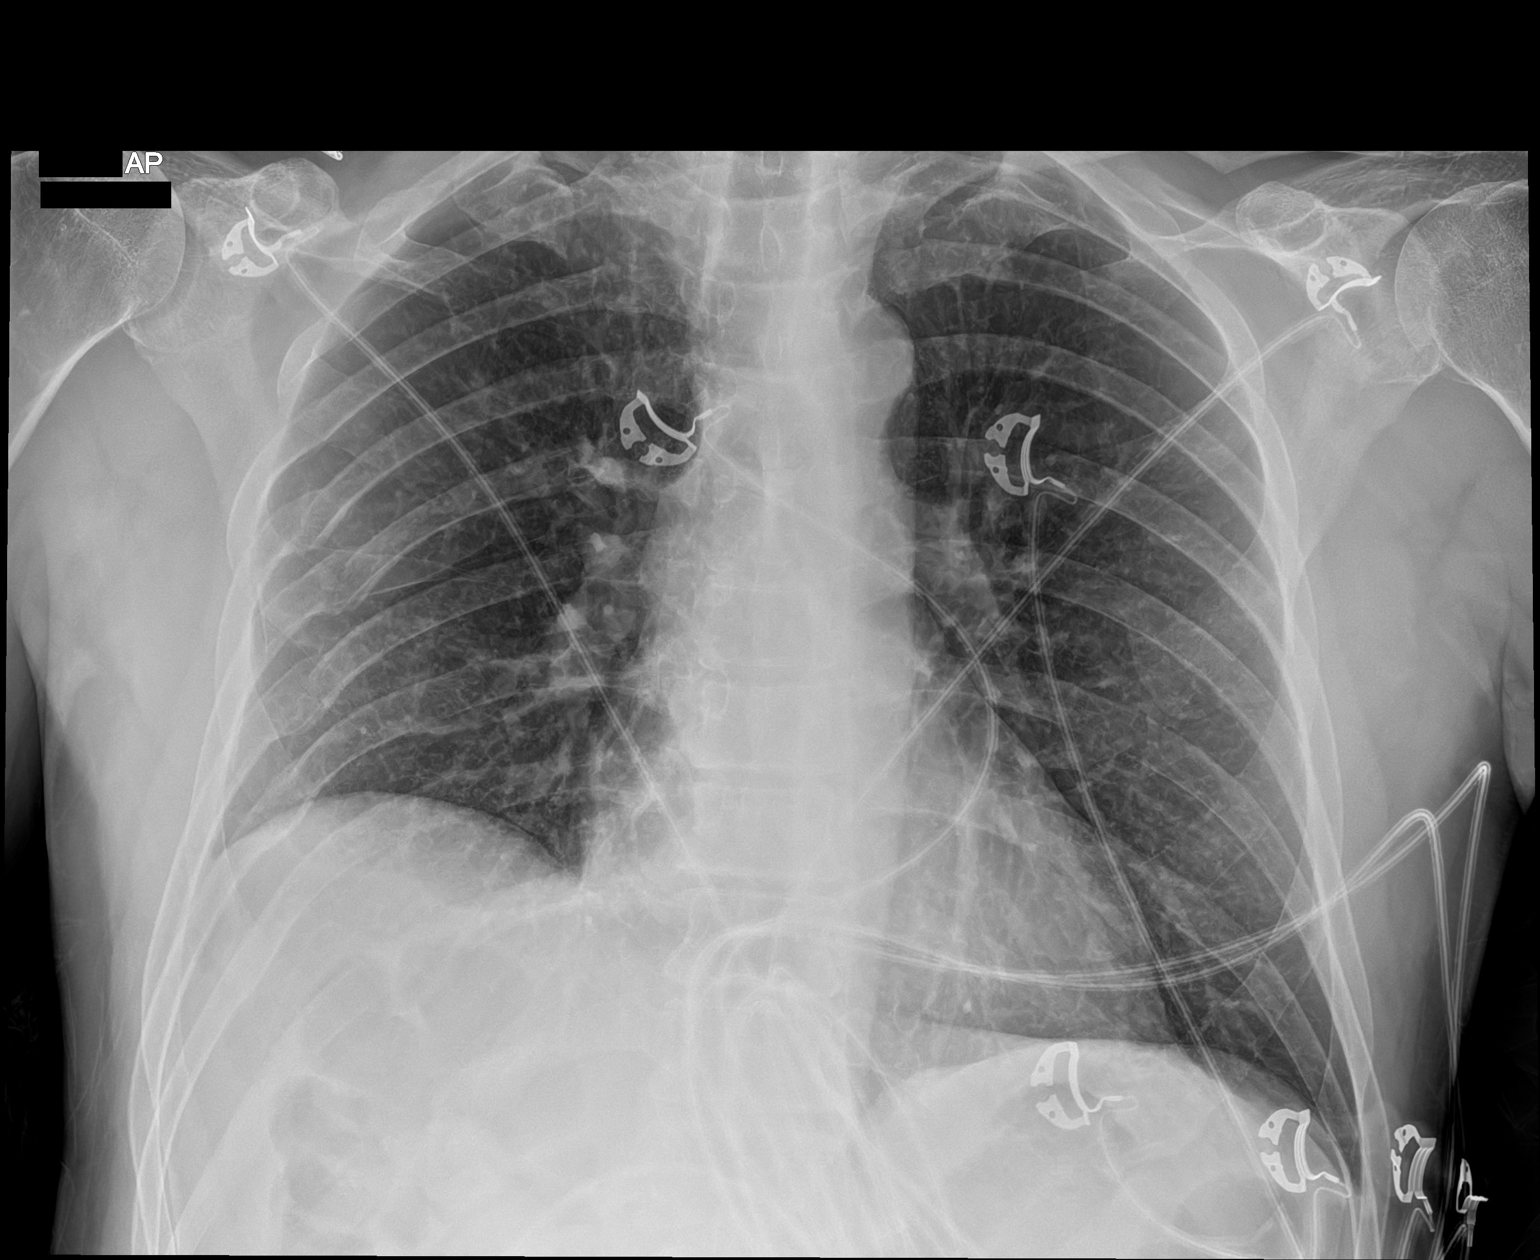

[1 of 1 positions shown; findings below may reference images not displayed]

FINDINGS: The cardiomediastinal contours are normal. Chronic elevation of
right hemidiaphragm, similar to prior exam. Pulmonary vasculature is
normal. No consolidation, pleural effusion, or pneumothorax.
Multiple remote right rib fractures. No acute osseous abnormalities
are seen.
IMPRESSION: No acute chest findings. Unchanged elevation of right hemidiaphragm.

## 2022-11-06 ENCOUNTER — Other Ambulatory Visit: Payer: Self-pay

## 2022-11-06 ENCOUNTER — Emergency Department (HOSPITAL_COMMUNITY): Payer: Medicare Other

## 2022-11-06 ENCOUNTER — Emergency Department (HOSPITAL_COMMUNITY)
Admission: EM | Admit: 2022-11-06 | Discharge: 2022-11-06 | Disposition: A | Discharge status: Home / Self Care | Payer: Medicare Other | Attending: Emergency Medicine | Admitting: Emergency Medicine

## 2022-11-06 ENCOUNTER — Encounter (HOSPITAL_COMMUNITY): Payer: Self-pay | Admitting: Emergency Medicine

## 2022-11-06 DIAGNOSIS — M25511 Pain in right shoulder: Secondary | ICD-10-CM | POA: Insufficient documentation

## 2022-11-06 DIAGNOSIS — Y92410 Unspecified street and highway as the place of occurrence of the external cause: Secondary | ICD-10-CM | POA: Insufficient documentation

## 2022-11-06 DIAGNOSIS — Z7982 Long term (current) use of aspirin: Secondary | ICD-10-CM | POA: Insufficient documentation

## 2022-11-06 DIAGNOSIS — S0990XA Unspecified injury of head, initial encounter: Secondary | ICD-10-CM | POA: Insufficient documentation

## 2022-11-06 MED ORDER — HYDROCODONE-ACETAMINOPHEN 5-325 MG PO TABS
1.0000 | ORAL_TABLET | Freq: Once | ORAL | Status: AC
Start: 2022-11-06 — End: 2022-11-06
  Administered 2022-11-06: 1 via ORAL
  Filled 2022-11-06: qty 1
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
## 2022-12-22 ENCOUNTER — Other Ambulatory Visit (HOSPITAL_COMMUNITY): Payer: Self-pay | Admitting: Family Medicine

## 2022-12-22 DIAGNOSIS — F17209 Nicotine dependence, unspecified, with unspecified nicotine-induced disorders: Secondary | ICD-10-CM

## 2022-12-23 ENCOUNTER — Encounter: Payer: Self-pay | Admitting: *Deleted

## 2022-12-24 ENCOUNTER — Ambulatory Visit
Admission: EM | Admit: 2022-12-24 | Discharge: 2022-12-24 | Disposition: A | Discharge status: Home / Self Care | Payer: Medicare Other | Attending: Physician Assistant | Admitting: Physician Assistant

## 2022-12-24 ENCOUNTER — Encounter: Payer: Self-pay | Admitting: Emergency Medicine

## 2022-12-24 DIAGNOSIS — K047 Periapical abscess without sinus: Secondary | ICD-10-CM

## 2022-12-24 MED ORDER — CLINDAMYCIN HCL 300 MG PO CAPS: 300.0000 mg | ORAL_CAPSULE | Freq: Three times a day (TID) | ORAL | 0 refills | Status: DC

## 2022-12-24 NOTE — Discharge Instructions (Signed)
Start clindamycin 3 times daily.  This can upset your stomach so take it with food.  If you develop any severe diarrhea stop the medication to be seen immediately.  Use ibuprofen and Tylenol for pain relief.  Gargle with warm salt water for additional symptom relief.  You should follow-up with dentist; call to schedule an appointment.  If you develop any worsening symptoms including difficulty swallowing, difficulty speaking, swelling of your throat, high fever, change in your voice you need to be seen immediately.

## 2022-12-24 NOTE — ED Triage Notes (Signed)
Dental pain on right lower side that started this morning.  Has been taking tylenol to help with pain.
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
## 2023-01-08 ENCOUNTER — Ambulatory Visit (HOSPITAL_COMMUNITY)
Admission: RE | Admit: 2023-01-08 | Discharge: 2023-01-08 | Disposition: A | Discharge status: Home / Self Care | Payer: Medicare Other | Source: Ambulatory Visit | Attending: Family Medicine | Admitting: Family Medicine

## 2023-01-08 DIAGNOSIS — F17210 Nicotine dependence, cigarettes, uncomplicated: Secondary | ICD-10-CM | POA: Insufficient documentation

## 2023-01-08 DIAGNOSIS — F17209 Nicotine dependence, unspecified, with unspecified nicotine-induced disorders: Secondary | ICD-10-CM | POA: Insufficient documentation

## 2023-01-08 DIAGNOSIS — Z122 Encounter for screening for malignant neoplasm of respiratory organs: Secondary | ICD-10-CM | POA: Insufficient documentation

## 2023-01-08 DIAGNOSIS — I700 Atherosclerosis of aorta: Secondary | ICD-10-CM | POA: Insufficient documentation

## 2023-01-12 ENCOUNTER — Telehealth: Payer: Self-pay | Admitting: Internal Medicine

## 2023-01-12 NOTE — Telephone Encounter (Signed)
Questionnaire in review

## 2023-01-18 NOTE — Telephone Encounter (Signed)
  Procedure: Colonoscopy  Height: 5'8 Weight: 150lbs        Have you had a colonoscopy before?  no  Do you have family history of colon cancer?  yes  Do you have a family history of polyps? no  Previous colonoscopy with polyps removed? no  Do you have a history colorectal cancer?   no  Are you diabetic?  no  Do you have a prosthetic or mechanical heart valve? no  Do you have a pacemaker/defibrillator?   no  Have you had endocarditis/atrial fibrillation?  no  Do you use supplemental oxygen/CPAP?  no  Have you had joint replacement within the last 12 months?  no  Do you tend to be constipated or have to use laxatives?  no   Do you have history of alcohol use? If yes, how much and how often.  no  Do you have history or are you using drugs? If yes, what do are you  using?  no  Have you ever had a stroke/heart attack?  no  Have you ever had a heart or other vascular stent placed,?no  Do you take weight loss medication? no  Do you take any blood-thinning medications such as: (Plavix, aspirin, Coumadin, Aggrenox, Brilinta, Xarelto, Eliquis, Pradaxa, Savaysa or Effient)? no  If yes we need the name, milligram, dosage and who is prescribing doctor:               Not taking any medications    Allergies  Allergen Reactions   Penicillins     Unknown reaction

## 2023-01-18 NOTE — Addendum Note (Signed)
Addended by: Armstead Peaks on: 01/18/2023 09:07 AM   Modules accepted: Orders

## 2023-01-28 NOTE — Telephone Encounter (Signed)
ASA 2. Okay to schedule. 

## 2023-02-01 NOTE — Telephone Encounter (Signed)
LMTRC

## 2023-02-07 ENCOUNTER — Encounter: Payer: Self-pay | Admitting: *Deleted

## 2023-02-07 NOTE — Telephone Encounter (Signed)
LMTRC

## 2023-02-23 ENCOUNTER — Encounter: Payer: Self-pay | Admitting: *Deleted

## 2023-03-08 ENCOUNTER — Encounter: Payer: Self-pay | Admitting: *Deleted

## 2023-03-08 NOTE — Telephone Encounter (Signed)
Mailed letter °

## 2023-03-14 ENCOUNTER — Encounter: Payer: Self-pay | Admitting: *Deleted

## 2023-03-14 ENCOUNTER — Other Ambulatory Visit: Payer: Self-pay | Admitting: *Deleted

## 2023-03-14 MED ORDER — PEG 3350-KCL-NA BICARB-NACL 420 G PO SOLR
4000.0000 mL | Freq: Once | ORAL | 0 refills | Status: AC
Start: 2023-03-14 — End: 2023-03-14

## 2023-03-14 NOTE — Telephone Encounter (Signed)
Pt has been scheduled for 03/31/23 with Dr.Rourk. Instructions mailed and prep sent to the pharmacy.

## 2023-03-15 ENCOUNTER — Encounter: Payer: Self-pay | Admitting: *Deleted

## 2023-03-15 NOTE — Telephone Encounter (Signed)
Referral completed, [facility] apt letter sent to PCP Caleb Blake, this is [facility] referral

## 2023-03-31 ENCOUNTER — Ambulatory Visit (HOSPITAL_COMMUNITY)
Admission: RE | Admit: 2023-03-31 | Discharge: 2023-03-31 | Disposition: A | Discharge status: Home / Self Care | Payer: Medicare Other | Attending: Internal Medicine | Admitting: Internal Medicine

## 2023-03-31 ENCOUNTER — Ambulatory Visit (HOSPITAL_COMMUNITY): Payer: Medicare Other | Admitting: Anesthesiology

## 2023-03-31 ENCOUNTER — Encounter (HOSPITAL_COMMUNITY)
Admission: RE | Disposition: A | Discharge status: Home / Self Care | Payer: Self-pay | Source: Home / Self Care | Attending: Internal Medicine

## 2023-03-31 ENCOUNTER — Encounter (HOSPITAL_COMMUNITY): Payer: Self-pay | Admitting: Internal Medicine

## 2023-03-31 ENCOUNTER — Other Ambulatory Visit: Payer: Self-pay

## 2023-03-31 DIAGNOSIS — D123 Benign neoplasm of transverse colon: Secondary | ICD-10-CM | POA: Insufficient documentation

## 2023-03-31 DIAGNOSIS — Z1211 Encounter for screening for malignant neoplasm of colon: Secondary | ICD-10-CM

## 2023-03-31 DIAGNOSIS — Z800 Family history of malignant neoplasm of digestive organs: Secondary | ICD-10-CM | POA: Insufficient documentation

## 2023-03-31 DIAGNOSIS — I10 Essential (primary) hypertension: Secondary | ICD-10-CM | POA: Insufficient documentation

## 2023-03-31 DIAGNOSIS — Z87891 Personal history of nicotine dependence: Secondary | ICD-10-CM | POA: Insufficient documentation

## 2023-03-31 HISTORY — PX: POLYPECTOMY: SHX5525

## 2023-03-31 HISTORY — PX: COLONOSCOPY WITH PROPOFOL: SHX5780

## 2023-03-31 SURGERY — COLONOSCOPY WITH PROPOFOL
Anesthesia: General

## 2023-03-31 MED ORDER — LIDOCAINE HCL (CARDIAC) PF 100 MG/5ML IV SOSY
PREFILLED_SYRINGE | INTRAVENOUS | Status: DC | PRN
  Administered 2023-03-31: 60 mg via INTRAVENOUS

## 2023-03-31 MED ORDER — PROPOFOL 10 MG/ML IV BOLUS
INTRAVENOUS | Status: DC | PRN
  Administered 2023-03-31: 100 mg via INTRAVENOUS

## 2023-03-31 MED ORDER — LIDOCAINE HCL (PF) 2 % IJ SOLN
INTRAMUSCULAR | Status: AC
  Filled 2023-03-31: qty 5

## 2023-03-31 MED ORDER — LACTATED RINGERS IV SOLN: INTRAVENOUS | Status: DC

## 2023-03-31 MED ORDER — PROPOFOL 500 MG/50ML IV EMUL
INTRAVENOUS | Status: DC | PRN
  Administered 2023-03-31: 150 ug/kg/min via INTRAVENOUS

## 2023-03-31 MED ORDER — STERILE WATER FOR IRRIGATION IR SOLN
Status: DC | PRN
  Administered 2023-03-31: 120 mL

## 2023-03-31 NOTE — Op Note (Signed)
[facility] Patient Name: Caleb Blake Procedure Date: 03/31/2023 9:28 AM MRN: [id number] Date of Birth: [birth year] Attending MD: Gennette Pac , MD, [id number] CSN: [id number] Age: 57 Admit Type: Outpatient Procedure:                Colonoscopy Indications:              Screening for colorectal malignant neoplasm Providers:                Gennette Pac, MD, Sheran Fava,                            Zena Amos Referring MD:              Medicines:                Propofol per Anesthesia Complications:            No immediate complications. Estimated Blood Loss:     Estimated blood loss was minimal. Procedure:                Pre-Anesthesia Assessment:                           - Prior to the procedure, a History and Physical                            was performed, and patient medications and                            allergies were reviewed. The patient's tolerance of                            previous anesthesia was also reviewed. The risks                            and benefits of the procedure and the sedation                            options and risks were discussed with the patient.                            All questions were answered, and informed consent                            was obtained. Prior Anticoagulants: The patient has                            taken no anticoagulant or antiplatelet agents. ASA                            Grade Assessment: II - A patient with mild systemic                            disease. After reviewing the risks and benefits,  the patient was deemed in satisfactory condition to                            undergo the procedure.                           After obtaining informed consent, the colonoscope                            was passed under direct vision. Throughout the                            procedure, the patient's blood pressure, pulse, and                             oxygen saturations were monitored continuously. The                            [id number]) scope was introduced through                            the anus and advanced to the the cecum, identified                            by appendiceal orifice and ileocecal valve. The                            colonoscopy was performed without difficulty. The                            patient tolerated the procedure well. The quality                            of the bowel preparation was adequate. The entire                            colon was well visualized. Scope In: 9:51:16 AM Scope Out: 10:02:54 AM Scope Withdrawal Time: 0 hours 6 minutes 43 seconds  Total Procedure Duration: 0 hours 11 minutes 38 seconds  Findings:      The perianal and digital rectal examinations were normal.      A 5 mm polyp was found in the distal transverse colon. The polyp was       sessile. The polyp was removed with a cold snare. Resection and       retrieval were complete. Estimated blood loss was minimal.      The exam was otherwise without abnormality on direct and retroflexion       views. Impression:               - One 5 mm polyp in the distal transverse colon,                            removed with a cold snare. Resected and retrieved.                           -  The examination was otherwise normal on direct                            and retroflexion views. Moderate Sedation:      Moderate (conscious) sedation was personally administered by an       anesthesia professional. The following parameters were monitored: oxygen       saturation, heart rate, blood pressure, respiratory rate, EKG, adequacy       of pulmonary ventilation, and response to care. Recommendation:           - Patient has a contact number available for                            emergencies. The signs and symptoms of potential                            delayed complications were discussed with the                             patient. Return to normal activities tomorrow.                            Written discharge instructions were provided to the                            patient.                           - Advance diet as tolerated.                           - Continue present medications.                           - Repeat colonoscopy date to be determined after                            pending pathology results are reviewed for                            surveillance.                           - Return to GI office (date not yet determined). Procedure Code(s):        --- Professional ---                           [phone], Colonoscopy, flexible; with removal of                            tumor(s), polyp(s), or other lesion(s) by snare                            technique Diagnosis Code(s):        --- Professional ---  Z12.11, Encounter for screening for malignant                            neoplasm of colon                           D12.3, Benign neoplasm of transverse colon (hepatic                            flexure or splenic flexure) CPT copyright 2022 [employer]. All rights reserved. The codes documented in this report are preliminary and upon coder review may  be revised to meet current compliance requirements. Gerrit Friends. Allexis Bordenave, MD Gennette Pac, MD 03/31/2023 10:13:05 AM This report has been signed electronically. Number of Addenda: 0

## 2023-03-31 NOTE — H&P (Signed)
@  ZYSA@   Primary Care Physician:  Lupita Raider, NP Primary Gastroenterologist:  Dr. Jena Gauss  Pre-Procedure History & Physical: HPI:  Caleb Blake is a 57 y.o. male is here for a screening colonoscopy.   No bowel symptoms.  No prior colonoscopy.  No family history of colon cancer.  Father had pancreatic cancer.  Past Medical History:  Diagnosis Date   Head injury    Rib fractures     Past Surgical History:  Procedure Laterality Date   ABDOMINAL SURGERY     INTRAMEDULLARY (IM) NAIL INTERTROCHANTERIC Left 11/25/2020   Procedure: INTRAMEDULLARY (IM) NAIL INTERTROCHANTRIC;  Surgeon: Vickki Hearing, MD;  Location: [facility];  Service: Orthopedics;  Laterality: Left;    Prior to Admission medications   Not on File    Allergies as of 03/14/2023 - Review Complete 01/18/2023  Allergen Reaction Noted   Penicillins  08/15/2019    Family History  Problem Relation Age of Onset   Diabetes Father     Social History   Socioeconomic History   Marital status: Single    Spouse name: Not on file   Number of children: Not on file   Years of education: Not on file   Highest education level: Not on file  Occupational History   Not on file  Tobacco Use   Smoking status: Former    Current packs/day: 0.00    Types: Cigarettes    Quit date: 08/15/2019    Years since quitting: 3.6   Smokeless tobacco: Never  Vaping Use   Vaping status: Never Used  Substance and Sexual Activity   Alcohol use: No   Drug use: No   Sexual activity: Not on file  Other Topics Concern   Not on file  Social History Narrative   Not on file   Social Drivers of Health   Financial Resource Strain: Not on file  Food Insecurity: Not on file  Transportation Needs: Not on file  Physical Activity: Not on file  Stress: Not on file  Social Connections: Not on file  Intimate Partner Violence: Not on file    Review of Systems: See HPI, otherwise negative ROS  Physical Exam: BP (!) 146/96   Pulse (!) 58    Temp 98 F (36.7 C) (Oral)   Resp (!) 21   Ht 5\' 7"  (1.702 m)   Wt 73.9 kg   SpO2 100%   BMI 25.53 kg/m  General:   Alert,  Well-developed, well-nourished, pleasant and cooperative in NAD Lungs:  Clear throughout to auscultation.   No wheezes, crackles, or rhonchi. No acute distress. Heart:  Regular rate and rhythm; no murmurs, clicks, rubs,  or gallops. Abdomen:  Soft, nontender and nondistended. No masses, hepatosplenomegaly or hernias noted. Normal bowel sounds, without guarding, and without rebound.   Impression/Plan: Caleb Blake is now here to undergo a screening colonoscopy.  Risks, benefits, limitations, imponderables and alternatives regarding colonoscopy have been reviewed with the patient. Questions have been answered. All parties agreeable.     Notice:  This dictation was prepared with Dragon dictation along with smaller phrase technology. Any transcriptional errors that result from this process are unintentional and may not be corrected upon review.

## 2023-03-31 NOTE — Transfer of Care (Addendum)
Immediate Anesthesia Transfer of Care Note  Patient: Caleb Blake  Procedure(s) Performed: COLONOSCOPY WITH PROPOFOL POLYPECTOMY  Patient Location: Endoscopy Unit  Anesthesia Type:General  Level of Consciousness: drowsy and patient cooperative  Airway & Oxygen Therapy: Patient Spontanous Breathing and Patient connected to nasal cannula oxygen  Post-op Assessment: Report given to RN and Post -op Vital signs reviewed and stable  Post vital signs: Reviewed and stable  Last Vitals:  Vitals Value Taken Time  BP 101/71 03/31/23   1006  Temp 36.7 03/31/23   1006  Pulse 58 03/31/23   1006  Resp    SpO2 97% 03/31/23   1006    Last Pain:  Vitals:   03/31/23 0944  TempSrc:   PainSc: 0-No pain      Patients Stated Pain Goal: 6 (03/31/23 0817)  Complications: No notable events documented.

## 2023-03-31 NOTE — Anesthesia Preprocedure Evaluation (Signed)
Anesthesia Evaluation  Patient identified by MRN, date of birth, ID band Patient awake    Reviewed: Allergy & Precautions, H&P , NPO status , Patient's Chart, lab work & pertinent test results, reviewed documented beta blocker date and time   Airway Mallampati: II  TM Distance: >3 FB Neck ROM: full    Dental no notable dental hx.    Pulmonary neg pulmonary ROS, Patient abstained from smoking., former smoker   Pulmonary exam normal breath sounds clear to auscultation       Cardiovascular Exercise Tolerance: Good hypertension, negative cardio ROS  Rhythm:regular Rate:Normal     Neuro/Psych  Neuromuscular disease negative neurological ROS  negative psych ROS   GI/Hepatic negative GI ROS, Neg liver ROS,,,  Endo/Other  negative endocrine ROS    Renal/GU negative Renal ROS  negative genitourinary   Musculoskeletal   Abdominal   Peds  Hematology negative hematology ROS (+)   Anesthesia Other Findings   Reproductive/Obstetrics negative OB ROS                             Anesthesia Physical Anesthesia Plan  ASA: 2  Anesthesia Plan: General   Post-op Pain Management:    Induction:   PONV Risk Score and Plan: Propofol infusion  Airway Management Planned:   Additional Equipment:   Intra-op Plan:   Post-operative Plan:   Informed Consent: I have reviewed the patients History and Physical, chart, labs and discussed the procedure including the risks, benefits and alternatives for the proposed anesthesia with the patient or authorized representative who has indicated his/her understanding and acceptance.     Dental Advisory Given  Plan Discussed with: CRNA  Anesthesia Plan Comments:        Anesthesia Quick Evaluation

## 2023-03-31 NOTE — Discharge Instructions (Signed)
  Colonoscopy Discharge Instructions  Read the instructions outlined below and refer to this sheet in the next few weeks. These discharge instructions provide you with general information on caring for yourself after you leave the hospital. Your doctor may also give you specific instructions. While your treatment has been planned according to the most current medical practices available, unavoidable complications occasionally occur. If you have any problems or questions after discharge, call Dr. Jena Gauss at [phone]. ACTIVITY You may resume your regular activity, but move at a slower pace for the next 24 hours.  Take frequent rest periods for the next 24 hours.  Walking will help get rid of the air and reduce the bloated feeling in your belly (abdomen).  No driving for 24 hours (because of the medicine (anesthesia) used during the test).   Do not sign any important legal documents or operate any machinery for 24 hours (because of the anesthesia used during the test).  NUTRITION Drink plenty of fluids.  You may resume your normal diet as instructed by your doctor.  Begin with a light meal and progress to your normal diet. Heavy or fried foods are harder to digest and may make you feel sick to your stomach (nauseated).  Avoid alcoholic beverages for 24 hours or as instructed.  MEDICATIONS You may resume your normal medications unless your doctor tells you otherwise.  WHAT YOU CAN EXPECT TODAY Some feelings of bloating in the abdomen.  Passage of more gas than usual.  Spotting of blood in your stool or on the toilet paper.  IF YOU HAD POLYPS REMOVED DURING THE COLONOSCOPY: No aspirin products for 7 days or as instructed.  No alcohol for 7 days or as instructed.  Eat a soft diet for the next 24 hours.  FINDING OUT THE RESULTS OF YOUR TEST Not all test results are available during your visit. If your test results are not back during the visit, make an appointment with your caregiver to find out the  results. Do not assume everything is normal if you have not heard from your caregiver or the medical facility. It is important for you to follow up on all of your test results.  SEEK IMMEDIATE MEDICAL ATTENTION IF: You have more than a spotting of blood in your stool.  Your belly is swollen (abdominal distention).  You are nauseated or vomiting.  You have a temperature over 101.  You have abdominal pain or discomfort that is severe or gets worse throughout the day.        1 small polyp found and removed today  Further recommendations to follow pending review of pathology report   at patient request, I called Alta Corning at [phone] findings and recommendations

## 2023-03-31 NOTE — Anesthesia Postprocedure Evaluation (Signed)
Anesthesia Post Note  Patient: Caleb Blake  Procedure(s) Performed: COLONOSCOPY WITH PROPOFOL POLYPECTOMY  Patient location during evaluation: Phase II Anesthesia Type: General Level of consciousness: awake Pain management: pain level controlled Vital Signs Assessment: post-procedure vital signs reviewed and stable Respiratory status: spontaneous breathing and respiratory function stable Cardiovascular status: blood pressure returned to baseline and stable Postop Assessment: no headache and no apparent nausea or vomiting Anesthetic complications: no Comments: Late entry   No notable events documented.   Last Vitals:  Vitals:   03/31/23 0817 03/31/23 1006  BP: (!) 146/96 101/71  Pulse: (!) 58 (!) 58  Resp: (!) 21   Temp: 36.7 C 36.7 C  SpO2: 100% 97%    Last Pain:  Vitals:   03/31/23 1006  TempSrc: Oral  PainSc: 0-No pain                 Windell Norfolk

## 2023-04-01 LAB — SURGICAL PATHOLOGY

## 2023-04-06 ENCOUNTER — Encounter: Payer: Self-pay | Admitting: Internal Medicine

## 2023-04-07 ENCOUNTER — Encounter (HOSPITAL_COMMUNITY): Payer: Self-pay | Admitting: Internal Medicine

## 2024-02-03 ENCOUNTER — Other Ambulatory Visit (HOSPITAL_COMMUNITY): Payer: Self-pay | Admitting: Family Medicine

## 2024-02-03 DIAGNOSIS — M545 Low back pain, unspecified: Secondary | ICD-10-CM

## 2024-02-10 ENCOUNTER — Other Ambulatory Visit (HOSPITAL_COMMUNITY): Payer: Self-pay | Admitting: Family Medicine

## 2024-02-10 DIAGNOSIS — F17200 Nicotine dependence, unspecified, uncomplicated: Secondary | ICD-10-CM

## 2024-02-26 ENCOUNTER — Ambulatory Visit (HOSPITAL_COMMUNITY)
Admission: RE | Admit: 2024-02-26 | Discharge: 2024-02-26 | Disposition: A | Discharge status: Home / Self Care | Source: Ambulatory Visit | Attending: Family Medicine | Admitting: Family Medicine

## 2024-02-26 DIAGNOSIS — K828 Other specified diseases of gallbladder: Secondary | ICD-10-CM | POA: Insufficient documentation

## 2024-02-26 DIAGNOSIS — Z122 Encounter for screening for malignant neoplasm of respiratory organs: Secondary | ICD-10-CM | POA: Insufficient documentation

## 2024-02-26 DIAGNOSIS — F17210 Nicotine dependence, cigarettes, uncomplicated: Secondary | ICD-10-CM | POA: Insufficient documentation

## 2024-02-26 DIAGNOSIS — I700 Atherosclerosis of aorta: Secondary | ICD-10-CM | POA: Insufficient documentation

## 2024-02-26 DIAGNOSIS — F17200 Nicotine dependence, unspecified, uncomplicated: Secondary | ICD-10-CM

## 2024-02-26 DIAGNOSIS — J439 Emphysema, unspecified: Secondary | ICD-10-CM | POA: Diagnosis not present

## 2024-03-09 ENCOUNTER — Encounter: Payer: Self-pay | Admitting: Internal Medicine

## 2024-04-17 ENCOUNTER — Other Ambulatory Visit: Payer: Self-pay | Admitting: *Deleted

## 2024-04-17 ENCOUNTER — Ambulatory Visit: Admitting: Internal Medicine

## 2024-04-17 ENCOUNTER — Telehealth: Payer: Self-pay | Admitting: *Deleted

## 2024-04-17 ENCOUNTER — Encounter: Payer: Self-pay | Admitting: Internal Medicine

## 2024-04-17 VITALS — BP 168/98 | HR 53 | Temp 98.3°F | Ht 66.0 in | Wt 165.4 lb

## 2024-04-17 DIAGNOSIS — K828 Other specified diseases of gallbladder: Secondary | ICD-10-CM

## 2024-04-17 DIAGNOSIS — K769 Liver disease, unspecified: Secondary | ICD-10-CM

## 2024-04-17 DIAGNOSIS — R9389 Abnormal findings on diagnostic imaging of other specified body structures: Secondary | ICD-10-CM

## 2024-04-17 NOTE — Telephone Encounter (Signed)
 Pt informed of US  appt details. Verbalized understanding

## 2024-04-17 NOTE — Progress Notes (Unsigned)
 "   Gastroenterology Progress Note    Primary Care Physician:  Joeann Browning, FNP Primary Gastroenterologist:  Dr. Shaaron  Pre-Procedure History & Physical: HPI:  Caleb Blake is a 58 y.o. male here for due to a possible calcification in the gallbladder wall versus stone seen on chest CT for screening (smoker).  He is a smoker.  He is absolutely devoid of any GI symptoms noted reflux postprandial abdominal pain odynophagia, dysphagia or early satiety bowel function appetite is good weight is stable.  Screening colonoscopy 2024 yielded a small adenomas; 7 years follow-up planned  Past Medical History:  Diagnosis Date   Head injury    Rib fractures     Past Surgical History:  Procedure Laterality Date   ABDOMINAL SURGERY     COLONOSCOPY WITH PROPOFOL  N/A 03/31/2023   Procedure: COLONOSCOPY WITH PROPOFOL ;  Surgeon: Shaaron Lamar HERO, MD;  Location: [facility];  Service: Endoscopy;  Laterality: N/A;  9:30 am, asa 2   INTRAMEDULLARY (IM) NAIL INTERTROCHANTERIC Left 11/25/2020   Procedure: INTRAMEDULLARY (IM) NAIL INTERTROCHANTRIC;  Surgeon: Margrette Taft BRAVO, MD;  Location: [facility];  Service: Orthopedics;  Laterality: Left;   POLYPECTOMY  03/31/2023   Procedure: POLYPECTOMY;  Surgeon: Shaaron Lamar HERO, MD;  Location: [facility];  Service: Endoscopy;;    Prior to Admission medications  Medication Sig Start Date End Date Taking? Authorizing Provider  atorvastatin (LIPITOR) 10 MG tablet Take 10 mg by mouth daily. 03/11/24  Yes [provider]    Allergies as of 04/17/2024 - Review Complete 04/17/2024  Allergen Reaction Noted   Penicillins  08/15/2019    Family History  Problem Relation Age of Onset   Diabetes Father     Social History   Socioeconomic History   Marital status: Single    Spouse name: Not on file   Number of children: Not on file   Years of education: Not on file   Highest education level: Not on file  Occupational History   Not on file   Tobacco Use   Smoking status: Former    Current packs/day: 0.00    Types: Cigarettes    Quit date: 08/15/2019    Years since quitting: 4.6   Smokeless tobacco: Never  Vaping Use   Vaping status: Never Used  Substance and Sexual Activity   Alcohol use: No   Drug use: No   Sexual activity: Not on file  Other Topics Concern   Not on file  Social History Narrative   Not on file   Social Drivers of Health   Tobacco Use: Medium Risk (04/17/2024)   Patient History    Smoking Tobacco Use: Former    Smokeless Tobacco Use: Never    Passive Exposure: Not on Actuary Strain: Not on file  Food Insecurity: Not on file  Transportation Needs: Not on file  Physical Activity: Not on file  Stress: Not on file  Social Connections: Not on file  Intimate Partner Violence: Not on file  Depression (PHQ2-9): Not on file  Alcohol Screen: Not on file  Housing: Not on file  Utilities: Not on file  Health Literacy: Not on file    Review of Systems   See HPI, otherwise negative ROS  Physical Exam: BP (!) 168/98   Pulse (!) 53   Temp 98.3 F (36.8 C) (Oral)   Ht 5' 6 (1.676 m)   Wt 165 lb 6.4 oz (75 kg)   SpO2 98%   BMI 26.70  kg/m  General:   Alert,  Well-developed, well-nourished, pleasant and cooperative in NAD Neck:  Supple; no masses or thyromegaly. No significant cervical adenopathy. Lungs:  Clear throughout to auscultation.   No wheezes, crackles, or rhonchi. No acute distress. Heart:  Regular rate and rhythm; no murmurs, clicks, rubs,  or gallops. Abdomen: Non-distended, normal bowel sounds.  Soft and nontender without appreciable mass or hepatosplenomegaly.   Impression/Plan:   58 year old gentleman essentially devoid of any GI symptoms sent over for further evaluation of 6 mm calcification in the wall of the gallbladder versus a stone seen on screening chest CT.  Again, Caleb Blake has no biliary or other GI symptoms.  LFTs previously normal 3 years ago.     Recommendations:  Proceed with a gallbladder ultrasound and update LFTs  Further recommendations to follow.    Notice: This dictation was prepared with Dragon dictation along with smaller phrase technology. Any transcriptional errors that result from this process are unintentional and may not be corrected upon review.  "

## 2024-04-17 NOTE — Patient Instructions (Signed)
 It was a nice to see you today.  CT findings were reviewed.  We will proceed with a gallbladder ultrasound to look for stones, excetra further.  Baseline hepatic function blood work.  Further recommendations to follow.

## 2024-04-17 NOTE — Telephone Encounter (Signed)
 Attempted to contact pt, unable to leave message due to vm being full  US  scheduled for Wednesday 04/25/24, arrive at 9:15 am to check in, NPO after midnight.

## 2024-04-18 LAB — HEPATIC FUNCTION PANEL
ALT: 18 IU/L (ref 0–44)
AST: 13 IU/L (ref 0–40)
Albumin: 4.5 g/dL (ref 3.8–4.9)
Alkaline Phosphatase: 79 IU/L (ref 47–123)
Bilirubin Total: 0.5 mg/dL (ref 0.0–1.2)
Bilirubin, Direct: 0.14 mg/dL (ref 0.00–0.40)
Total Protein: 7.2 g/dL (ref 6.0–8.5)

## 2024-04-25 ENCOUNTER — Ambulatory Visit (HOSPITAL_COMMUNITY)
Admission: RE | Admit: 2024-04-25 | Discharge: 2024-04-25 | Disposition: A | Discharge status: Home / Self Care | Source: Ambulatory Visit | Attending: Internal Medicine | Admitting: Internal Medicine

## 2024-04-25 DIAGNOSIS — R9389 Abnormal findings on diagnostic imaging of other specified body structures: Secondary | ICD-10-CM | POA: Insufficient documentation

## 2024-04-26 ENCOUNTER — Ambulatory Visit: Payer: Self-pay | Admitting: Internal Medicine

## 2024-05-08 ENCOUNTER — Encounter: Payer: Self-pay | Admitting: Nurse Practitioner
